# Patient Record
Sex: Female | Born: 1945 | Race: White | Hispanic: No | Marital: Married | Smoking: Current every day smoker
Health system: Southern US, Community
[De-identification: ages and names within clinical notes are randomized; demographics above are authoritative.]

## PROBLEM LIST (undated history)

## (undated) DIAGNOSIS — F32A Depression, unspecified: Secondary | ICD-10-CM

## (undated) DIAGNOSIS — T8859XA Other complications of anesthesia, initial encounter: Secondary | ICD-10-CM

## (undated) DIAGNOSIS — E119 Type 2 diabetes mellitus without complications: Secondary | ICD-10-CM

## (undated) DIAGNOSIS — M1990 Unspecified osteoarthritis, unspecified site: Secondary | ICD-10-CM

## (undated) DIAGNOSIS — I10 Essential (primary) hypertension: Secondary | ICD-10-CM

## (undated) DIAGNOSIS — Z9889 Other specified postprocedural states: Secondary | ICD-10-CM

## (undated) DIAGNOSIS — J449 Chronic obstructive pulmonary disease, unspecified: Secondary | ICD-10-CM

## (undated) DIAGNOSIS — Z8489 Family history of other specified conditions: Secondary | ICD-10-CM

## (undated) DIAGNOSIS — N189 Chronic kidney disease, unspecified: Secondary | ICD-10-CM

## (undated) DIAGNOSIS — E785 Hyperlipidemia, unspecified: Secondary | ICD-10-CM

## (undated) DIAGNOSIS — R112 Nausea with vomiting, unspecified: Secondary | ICD-10-CM

## (undated) DIAGNOSIS — F419 Anxiety disorder, unspecified: Secondary | ICD-10-CM

## (undated) HISTORY — PX: TONSILLECTOMY: SUR1361

## (undated) HISTORY — PX: EYE SURGERY: SHX253

## (undated) HISTORY — PX: ADENOIDECTOMY: SUR15

## (undated) HISTORY — PX: APPENDECTOMY: SHX54

## (undated) HISTORY — PX: OTHER SURGICAL HISTORY: SHX169

## (undated) HISTORY — PX: CHOLECYSTECTOMY: SHX55

## (undated) HISTORY — PX: TUBAL LIGATION: SHX77

---

## 2007-05-17 ENCOUNTER — Ambulatory Visit (HOSPITAL_BASED_OUTPATIENT_CLINIC_OR_DEPARTMENT_OTHER): Admission: RE | Admit: 2007-05-17 | Discharge: 2007-05-17 | Payer: Self-pay | Admitting: Orthopedic Surgery

## 2010-07-22 NOTE — Op Note (Signed)
NAMEOLUWATOYIN, BANALES                  ACCOUNT NO.:  [id number]   MEDICAL RECORD NO.:  [id number]          PATIENT TYPE:  AMB   LOCATION:  DSC                          FACILITY:  [facility]   PHYSICIAN:  Cindee Salt, M.D.       DATE OF BIRTH:  April 18, 1945   DATE OF PROCEDURE:  05/17/2007  DATE OF DISCHARGE:                               OPERATIVE REPORT   PREOPERATIVE DIAGNOSIS:  Carpal tunnel syndrome, right hand.   POSTOPERATIVE DIAGNOSIS:  Carpal tunnel syndrome, right hand.   OPERATION:  Decompression, right median nerve.   SURGEON:  Kuzma.   ASSISTANT:  Carolyne Fiscal, R.N.   ANESTHESIA:  Forearm-based IV regional.   DATE OF OPERATION:  May 17, 2007.   ANESTHESIOLOGIST:  Crews.   HISTORY:  The patient is a 65 year old female with a history of carpal  tunnel syndrome, EMG nerve conductions positive.  This has not responded  to conservative treatment.  She has elected to undergo carpal tunnel  release.  She is aware of risks and complications including infection,  recurrence, injury to arteries - nerves - tendons, incomplete relief of  symptoms, dystrophy.  In the preoperative area, the patient is seen.  The extremity marked by both the patient and surgeon.  Antibiotic given.   PROCEDURE:  The patient was brought to the operating room.  A forearm-  based IV regional anesthetic was carried out without difficulty.  She  was prepped using DuraPrep, supine position, right arm free.  After a  time-out, a longitudinal incision was made in the palm and carried down  through subcutaneous tissue.  Bleeders were electrocauterized.  Palmar  fascia split, superficial palmar arch identified.  The flexor tendon to  the ring and little finger identified to the ulnar side of median nerve.  Carpal retinaculum was incised with sharp dissection.  Right angle and  Sewall retractor were placed between skin and forearm fascia.  Fascia  released for approximately a centimeter and half proximal to the wrist  crease under direct vision.  Canal was explored.  Compression of the  nerve was apparent.  Persistent median artery was present without  thrombosis.  No further lesions were identified.  The wound was  irrigated.  Skin closed with interrupted 5-0 Vicryl Rapide sutures.  Sterile compressive dressing and  splint to the wrist applied.  The patient tolerated the procedure well  and was taken to recovery room for observation in satisfactory  condition.  She will be discharged home to return to the [facility]  [place] in 1 week on Vicodin.           ______________________________  Cindee Salt, M.D.     GK/MEDQ  D:  05/17/2007  T:  05/18/2007  Job:  [id number]   cc:   Cindee Salt, M.D.

## 2010-12-01 LAB — BASIC METABOLIC PANEL
BUN: 28 — ABNORMAL HIGH
CO2: 30
Calcium: 10.6 — ABNORMAL HIGH
Chloride: 103
Creatinine, Ser: 1.49 — ABNORMAL HIGH
GFR calc Af Amer: 43 — ABNORMAL LOW
GFR calc non Af Amer: 36 — ABNORMAL LOW
Glucose, Bld: 105 — ABNORMAL HIGH
Potassium: 4.8
Sodium: 140

## 2010-12-01 LAB — POCT HEMOGLOBIN-HEMACUE: Hemoglobin: 13.5

## 2012-12-08 DIAGNOSIS — R1312 Dysphagia, oropharyngeal phase: Secondary | ICD-10-CM | POA: Diagnosis not present

## 2012-12-08 DIAGNOSIS — Z6829 Body mass index (BMI) 29.0-29.9, adult: Secondary | ICD-10-CM | POA: Diagnosis not present

## 2012-12-08 DIAGNOSIS — Z1389 Encounter for screening for other disorder: Secondary | ICD-10-CM | POA: Diagnosis not present

## 2012-12-08 DIAGNOSIS — Z23 Encounter for immunization: Secondary | ICD-10-CM | POA: Diagnosis not present

## 2012-12-08 DIAGNOSIS — F17200 Nicotine dependence, unspecified, uncomplicated: Secondary | ICD-10-CM | POA: Diagnosis not present

## 2012-12-22 DIAGNOSIS — K219 Gastro-esophageal reflux disease without esophagitis: Secondary | ICD-10-CM | POA: Diagnosis not present

## 2012-12-22 DIAGNOSIS — R1310 Dysphagia, unspecified: Secondary | ICD-10-CM | POA: Diagnosis not present

## 2012-12-27 DIAGNOSIS — R1310 Dysphagia, unspecified: Secondary | ICD-10-CM | POA: Diagnosis not present

## 2012-12-27 DIAGNOSIS — K219 Gastro-esophageal reflux disease without esophagitis: Secondary | ICD-10-CM | POA: Diagnosis not present

## 2013-01-05 DIAGNOSIS — D509 Iron deficiency anemia, unspecified: Secondary | ICD-10-CM | POA: Diagnosis not present

## 2013-01-05 DIAGNOSIS — N183 Chronic kidney disease, stage 3 unspecified: Secondary | ICD-10-CM | POA: Diagnosis not present

## 2013-01-05 DIAGNOSIS — I10 Essential (primary) hypertension: Secondary | ICD-10-CM | POA: Diagnosis not present

## 2013-01-05 DIAGNOSIS — E1129 Type 2 diabetes mellitus with other diabetic kidney complication: Secondary | ICD-10-CM | POA: Diagnosis not present

## 2013-01-05 DIAGNOSIS — Z8349 Family history of other endocrine, nutritional and metabolic diseases: Secondary | ICD-10-CM | POA: Diagnosis not present

## 2013-01-05 DIAGNOSIS — R809 Proteinuria, unspecified: Secondary | ICD-10-CM | POA: Diagnosis not present

## 2013-01-11 DIAGNOSIS — K200 Eosinophilic esophagitis: Secondary | ICD-10-CM | POA: Diagnosis not present

## 2013-01-11 DIAGNOSIS — F411 Generalized anxiety disorder: Secondary | ICD-10-CM | POA: Diagnosis not present

## 2013-01-11 DIAGNOSIS — K209 Esophagitis, unspecified without bleeding: Secondary | ICD-10-CM | POA: Diagnosis not present

## 2013-01-11 DIAGNOSIS — K219 Gastro-esophageal reflux disease without esophagitis: Secondary | ICD-10-CM | POA: Diagnosis not present

## 2013-01-11 DIAGNOSIS — E119 Type 2 diabetes mellitus without complications: Secondary | ICD-10-CM | POA: Diagnosis not present

## 2013-01-11 DIAGNOSIS — E782 Mixed hyperlipidemia: Secondary | ICD-10-CM | POA: Diagnosis not present

## 2013-01-11 DIAGNOSIS — I129 Hypertensive chronic kidney disease with stage 1 through stage 4 chronic kidney disease, or unspecified chronic kidney disease: Secondary | ICD-10-CM | POA: Diagnosis not present

## 2013-01-11 DIAGNOSIS — K449 Diaphragmatic hernia without obstruction or gangrene: Secondary | ICD-10-CM | POA: Diagnosis not present

## 2013-01-11 DIAGNOSIS — N189 Chronic kidney disease, unspecified: Secondary | ICD-10-CM | POA: Diagnosis not present

## 2013-01-11 DIAGNOSIS — K222 Esophageal obstruction: Secondary | ICD-10-CM | POA: Diagnosis not present

## 2013-01-11 DIAGNOSIS — Z79899 Other long term (current) drug therapy: Secondary | ICD-10-CM | POA: Diagnosis not present

## 2013-01-11 DIAGNOSIS — D379 Neoplasm of uncertain behavior of digestive organ, unspecified: Secondary | ICD-10-CM | POA: Diagnosis not present

## 2013-01-11 DIAGNOSIS — R1310 Dysphagia, unspecified: Secondary | ICD-10-CM | POA: Diagnosis not present

## 2013-01-11 DIAGNOSIS — F329 Major depressive disorder, single episode, unspecified: Secondary | ICD-10-CM | POA: Diagnosis not present

## 2013-01-25 DIAGNOSIS — I10 Essential (primary) hypertension: Secondary | ICD-10-CM | POA: Diagnosis not present

## 2013-01-25 DIAGNOSIS — E1149 Type 2 diabetes mellitus with other diabetic neurological complication: Secondary | ICD-10-CM | POA: Diagnosis not present

## 2013-01-25 DIAGNOSIS — E782 Mixed hyperlipidemia: Secondary | ICD-10-CM | POA: Diagnosis not present

## 2013-02-01 DIAGNOSIS — N183 Chronic kidney disease, stage 3 unspecified: Secondary | ICD-10-CM | POA: Diagnosis not present

## 2013-02-01 DIAGNOSIS — F17200 Nicotine dependence, unspecified, uncomplicated: Secondary | ICD-10-CM | POA: Diagnosis not present

## 2013-02-01 DIAGNOSIS — Z1331 Encounter for screening for depression: Secondary | ICD-10-CM | POA: Diagnosis not present

## 2013-02-01 DIAGNOSIS — E1149 Type 2 diabetes mellitus with other diabetic neurological complication: Secondary | ICD-10-CM | POA: Diagnosis not present

## 2013-02-01 DIAGNOSIS — Z139 Encounter for screening, unspecified: Secondary | ICD-10-CM | POA: Diagnosis not present

## 2013-02-01 DIAGNOSIS — I129 Hypertensive chronic kidney disease with stage 1 through stage 4 chronic kidney disease, or unspecified chronic kidney disease: Secondary | ICD-10-CM | POA: Diagnosis not present

## 2013-02-01 DIAGNOSIS — Z23 Encounter for immunization: Secondary | ICD-10-CM | POA: Diagnosis not present

## 2013-02-10 DIAGNOSIS — Z1231 Encounter for screening mammogram for malignant neoplasm of breast: Secondary | ICD-10-CM | POA: Diagnosis not present

## 2013-03-08 DIAGNOSIS — R6889 Other general symptoms and signs: Secondary | ICD-10-CM | POA: Diagnosis not present

## 2013-03-21 DIAGNOSIS — K219 Gastro-esophageal reflux disease without esophagitis: Secondary | ICD-10-CM | POA: Diagnosis not present

## 2013-03-21 DIAGNOSIS — Z1211 Encounter for screening for malignant neoplasm of colon: Secondary | ICD-10-CM | POA: Diagnosis not present

## 2013-03-21 DIAGNOSIS — Z86010 Personal history of colonic polyps: Secondary | ICD-10-CM | POA: Diagnosis not present

## 2013-03-21 DIAGNOSIS — R1310 Dysphagia, unspecified: Secondary | ICD-10-CM | POA: Diagnosis not present

## 2013-05-15 DIAGNOSIS — E782 Mixed hyperlipidemia: Secondary | ICD-10-CM | POA: Diagnosis not present

## 2013-05-15 DIAGNOSIS — E1149 Type 2 diabetes mellitus with other diabetic neurological complication: Secondary | ICD-10-CM | POA: Diagnosis not present

## 2013-05-15 DIAGNOSIS — N183 Chronic kidney disease, stage 3 unspecified: Secondary | ICD-10-CM | POA: Diagnosis not present

## 2013-05-15 DIAGNOSIS — I129 Hypertensive chronic kidney disease with stage 1 through stage 4 chronic kidney disease, or unspecified chronic kidney disease: Secondary | ICD-10-CM | POA: Diagnosis not present

## 2013-05-19 DIAGNOSIS — N183 Chronic kidney disease, stage 3 unspecified: Secondary | ICD-10-CM | POA: Diagnosis not present

## 2013-05-19 DIAGNOSIS — H9190 Unspecified hearing loss, unspecified ear: Secondary | ICD-10-CM | POA: Diagnosis not present

## 2013-05-19 DIAGNOSIS — I129 Hypertensive chronic kidney disease with stage 1 through stage 4 chronic kidney disease, or unspecified chronic kidney disease: Secondary | ICD-10-CM | POA: Diagnosis not present

## 2013-05-19 DIAGNOSIS — Z1331 Encounter for screening for depression: Secondary | ICD-10-CM | POA: Diagnosis not present

## 2013-05-19 DIAGNOSIS — Z0000 Encounter for general adult medical examination without abnormal findings: Secondary | ICD-10-CM | POA: Diagnosis not present

## 2013-05-19 DIAGNOSIS — Z139 Encounter for screening, unspecified: Secondary | ICD-10-CM | POA: Diagnosis not present

## 2013-05-22 DIAGNOSIS — E1149 Type 2 diabetes mellitus with other diabetic neurological complication: Secondary | ICD-10-CM | POA: Diagnosis not present

## 2013-05-25 DIAGNOSIS — J301 Allergic rhinitis due to pollen: Secondary | ICD-10-CM | POA: Diagnosis not present

## 2013-05-31 DIAGNOSIS — K5730 Diverticulosis of large intestine without perforation or abscess without bleeding: Secondary | ICD-10-CM | POA: Diagnosis not present

## 2013-05-31 DIAGNOSIS — Z1211 Encounter for screening for malignant neoplasm of colon: Secondary | ICD-10-CM | POA: Diagnosis not present

## 2013-05-31 DIAGNOSIS — R1310 Dysphagia, unspecified: Secondary | ICD-10-CM | POA: Diagnosis not present

## 2013-05-31 DIAGNOSIS — I129 Hypertensive chronic kidney disease with stage 1 through stage 4 chronic kidney disease, or unspecified chronic kidney disease: Secondary | ICD-10-CM | POA: Diagnosis not present

## 2013-05-31 DIAGNOSIS — N189 Chronic kidney disease, unspecified: Secondary | ICD-10-CM | POA: Diagnosis not present

## 2013-05-31 DIAGNOSIS — E119 Type 2 diabetes mellitus without complications: Secondary | ICD-10-CM | POA: Diagnosis not present

## 2013-05-31 DIAGNOSIS — K200 Eosinophilic esophagitis: Secondary | ICD-10-CM | POA: Diagnosis not present

## 2013-05-31 DIAGNOSIS — Z79899 Other long term (current) drug therapy: Secondary | ICD-10-CM | POA: Diagnosis not present

## 2013-05-31 DIAGNOSIS — Z7982 Long term (current) use of aspirin: Secondary | ICD-10-CM | POA: Diagnosis not present

## 2013-05-31 DIAGNOSIS — F341 Dysthymic disorder: Secondary | ICD-10-CM | POA: Diagnosis not present

## 2013-05-31 DIAGNOSIS — F17200 Nicotine dependence, unspecified, uncomplicated: Secondary | ICD-10-CM | POA: Diagnosis not present

## 2013-05-31 DIAGNOSIS — D126 Benign neoplasm of colon, unspecified: Secondary | ICD-10-CM | POA: Diagnosis not present

## 2013-05-31 DIAGNOSIS — E785 Hyperlipidemia, unspecified: Secondary | ICD-10-CM | POA: Diagnosis not present

## 2013-05-31 DIAGNOSIS — K648 Other hemorrhoids: Secondary | ICD-10-CM | POA: Diagnosis not present

## 2013-06-22 DIAGNOSIS — N183 Chronic kidney disease, stage 3 unspecified: Secondary | ICD-10-CM | POA: Diagnosis not present

## 2013-06-22 DIAGNOSIS — R809 Proteinuria, unspecified: Secondary | ICD-10-CM | POA: Diagnosis not present

## 2013-06-23 DIAGNOSIS — E1129 Type 2 diabetes mellitus with other diabetic kidney complication: Secondary | ICD-10-CM | POA: Diagnosis not present

## 2013-06-23 DIAGNOSIS — R809 Proteinuria, unspecified: Secondary | ICD-10-CM | POA: Diagnosis not present

## 2013-06-23 DIAGNOSIS — N183 Chronic kidney disease, stage 3 unspecified: Secondary | ICD-10-CM | POA: Diagnosis not present

## 2013-06-23 DIAGNOSIS — I10 Essential (primary) hypertension: Secondary | ICD-10-CM | POA: Diagnosis not present

## 2013-08-08 DIAGNOSIS — E1149 Type 2 diabetes mellitus with other diabetic neurological complication: Secondary | ICD-10-CM | POA: Diagnosis not present

## 2013-08-08 DIAGNOSIS — I129 Hypertensive chronic kidney disease with stage 1 through stage 4 chronic kidney disease, or unspecified chronic kidney disease: Secondary | ICD-10-CM | POA: Diagnosis not present

## 2013-08-08 DIAGNOSIS — E782 Mixed hyperlipidemia: Secondary | ICD-10-CM | POA: Diagnosis not present

## 2013-08-08 DIAGNOSIS — N183 Chronic kidney disease, stage 3 unspecified: Secondary | ICD-10-CM | POA: Diagnosis not present

## 2013-08-15 DIAGNOSIS — N183 Chronic kidney disease, stage 3 unspecified: Secondary | ICD-10-CM | POA: Diagnosis not present

## 2013-08-15 DIAGNOSIS — E782 Mixed hyperlipidemia: Secondary | ICD-10-CM | POA: Diagnosis not present

## 2013-08-15 DIAGNOSIS — I129 Hypertensive chronic kidney disease with stage 1 through stage 4 chronic kidney disease, or unspecified chronic kidney disease: Secondary | ICD-10-CM | POA: Diagnosis not present

## 2013-08-15 DIAGNOSIS — E1149 Type 2 diabetes mellitus with other diabetic neurological complication: Secondary | ICD-10-CM | POA: Diagnosis not present

## 2013-09-12 DIAGNOSIS — N183 Chronic kidney disease, stage 3 unspecified: Secondary | ICD-10-CM | POA: Diagnosis not present

## 2013-10-03 DIAGNOSIS — H524 Presbyopia: Secondary | ICD-10-CM | POA: Diagnosis not present

## 2013-10-03 DIAGNOSIS — E119 Type 2 diabetes mellitus without complications: Secondary | ICD-10-CM | POA: Diagnosis not present

## 2013-10-05 DIAGNOSIS — N183 Chronic kidney disease, stage 3 unspecified: Secondary | ICD-10-CM | POA: Diagnosis not present

## 2013-12-15 DIAGNOSIS — E1140 Type 2 diabetes mellitus with diabetic neuropathy, unspecified: Secondary | ICD-10-CM | POA: Diagnosis not present

## 2013-12-15 DIAGNOSIS — E782 Mixed hyperlipidemia: Secondary | ICD-10-CM | POA: Diagnosis not present

## 2013-12-15 DIAGNOSIS — I129 Hypertensive chronic kidney disease with stage 1 through stage 4 chronic kidney disease, or unspecified chronic kidney disease: Secondary | ICD-10-CM | POA: Diagnosis not present

## 2013-12-22 DIAGNOSIS — I129 Hypertensive chronic kidney disease with stage 1 through stage 4 chronic kidney disease, or unspecified chronic kidney disease: Secondary | ICD-10-CM | POA: Diagnosis not present

## 2013-12-22 DIAGNOSIS — R7989 Other specified abnormal findings of blood chemistry: Secondary | ICD-10-CM | POA: Diagnosis not present

## 2013-12-22 DIAGNOSIS — E1140 Type 2 diabetes mellitus with diabetic neuropathy, unspecified: Secondary | ICD-10-CM | POA: Diagnosis not present

## 2013-12-22 DIAGNOSIS — E785 Hyperlipidemia, unspecified: Secondary | ICD-10-CM | POA: Diagnosis not present

## 2014-01-03 DIAGNOSIS — K760 Fatty (change of) liver, not elsewhere classified: Secondary | ICD-10-CM | POA: Diagnosis not present

## 2014-01-03 DIAGNOSIS — Z09 Encounter for follow-up examination after completed treatment for conditions other than malignant neoplasm: Secondary | ICD-10-CM | POA: Diagnosis not present

## 2014-01-04 DIAGNOSIS — F17200 Nicotine dependence, unspecified, uncomplicated: Secondary | ICD-10-CM | POA: Diagnosis not present

## 2014-01-04 DIAGNOSIS — R748 Abnormal levels of other serum enzymes: Secondary | ICD-10-CM | POA: Diagnosis not present

## 2014-01-04 DIAGNOSIS — K760 Fatty (change of) liver, not elsewhere classified: Secondary | ICD-10-CM | POA: Diagnosis not present

## 2014-01-04 DIAGNOSIS — Z6830 Body mass index (BMI) 30.0-30.9, adult: Secondary | ICD-10-CM | POA: Diagnosis not present

## 2014-02-09 DIAGNOSIS — K760 Fatty (change of) liver, not elsewhere classified: Secondary | ICD-10-CM | POA: Diagnosis not present

## 2014-02-12 DIAGNOSIS — R7989 Other specified abnormal findings of blood chemistry: Secondary | ICD-10-CM | POA: Diagnosis not present

## 2014-02-12 DIAGNOSIS — R740 Nonspecific elevation of levels of transaminase and lactic acid dehydrogenase [LDH]: Secondary | ICD-10-CM | POA: Diagnosis not present

## 2014-02-12 DIAGNOSIS — K760 Fatty (change of) liver, not elsewhere classified: Secondary | ICD-10-CM | POA: Diagnosis not present

## 2014-03-08 DIAGNOSIS — R05 Cough: Secondary | ICD-10-CM | POA: Diagnosis not present

## 2014-03-08 DIAGNOSIS — Z6829 Body mass index (BMI) 29.0-29.9, adult: Secondary | ICD-10-CM | POA: Diagnosis not present

## 2014-03-08 DIAGNOSIS — F17200 Nicotine dependence, unspecified, uncomplicated: Secondary | ICD-10-CM | POA: Diagnosis not present

## 2014-03-09 DIAGNOSIS — D649 Anemia, unspecified: Secondary | ICD-10-CM

## 2014-03-09 HISTORY — DX: Anemia, unspecified: D64.9

## 2014-03-12 DIAGNOSIS — I129 Hypertensive chronic kidney disease with stage 1 through stage 4 chronic kidney disease, or unspecified chronic kidney disease: Secondary | ICD-10-CM | POA: Diagnosis not present

## 2014-03-12 DIAGNOSIS — E1144 Type 2 diabetes mellitus with diabetic amyotrophy: Secondary | ICD-10-CM | POA: Diagnosis not present

## 2014-03-12 DIAGNOSIS — E782 Mixed hyperlipidemia: Secondary | ICD-10-CM | POA: Diagnosis not present

## 2014-03-22 DIAGNOSIS — E1144 Type 2 diabetes mellitus with diabetic amyotrophy: Secondary | ICD-10-CM | POA: Diagnosis not present

## 2014-03-22 DIAGNOSIS — N183 Chronic kidney disease, stage 3 (moderate): Secondary | ICD-10-CM | POA: Diagnosis not present

## 2014-03-22 DIAGNOSIS — Z23 Encounter for immunization: Secondary | ICD-10-CM | POA: Diagnosis not present

## 2014-03-22 DIAGNOSIS — K760 Fatty (change of) liver, not elsewhere classified: Secondary | ICD-10-CM | POA: Diagnosis not present

## 2014-03-22 DIAGNOSIS — I129 Hypertensive chronic kidney disease with stage 1 through stage 4 chronic kidney disease, or unspecified chronic kidney disease: Secondary | ICD-10-CM | POA: Diagnosis not present

## 2014-03-28 DIAGNOSIS — Z1231 Encounter for screening mammogram for malignant neoplasm of breast: Secondary | ICD-10-CM | POA: Diagnosis not present

## 2014-04-17 DIAGNOSIS — R809 Proteinuria, unspecified: Secondary | ICD-10-CM | POA: Diagnosis not present

## 2014-04-17 DIAGNOSIS — N183 Chronic kidney disease, stage 3 (moderate): Secondary | ICD-10-CM | POA: Diagnosis not present

## 2014-04-20 DIAGNOSIS — E1129 Type 2 diabetes mellitus with other diabetic kidney complication: Secondary | ICD-10-CM | POA: Diagnosis not present

## 2014-04-20 DIAGNOSIS — N183 Chronic kidney disease, stage 3 (moderate): Secondary | ICD-10-CM | POA: Diagnosis not present

## 2014-04-20 DIAGNOSIS — R809 Proteinuria, unspecified: Secondary | ICD-10-CM | POA: Diagnosis not present

## 2014-04-20 DIAGNOSIS — I10 Essential (primary) hypertension: Secondary | ICD-10-CM | POA: Diagnosis not present

## 2014-05-25 DIAGNOSIS — Z23 Encounter for immunization: Secondary | ICD-10-CM | POA: Diagnosis not present

## 2014-06-06 DIAGNOSIS — Z139 Encounter for screening, unspecified: Secondary | ICD-10-CM | POA: Diagnosis not present

## 2014-06-06 DIAGNOSIS — Z1389 Encounter for screening for other disorder: Secondary | ICD-10-CM | POA: Diagnosis not present

## 2014-06-06 DIAGNOSIS — Z0000 Encounter for general adult medical examination without abnormal findings: Secondary | ICD-10-CM | POA: Diagnosis not present

## 2014-06-14 DIAGNOSIS — E782 Mixed hyperlipidemia: Secondary | ICD-10-CM | POA: Diagnosis not present

## 2014-06-14 DIAGNOSIS — I129 Hypertensive chronic kidney disease with stage 1 through stage 4 chronic kidney disease, or unspecified chronic kidney disease: Secondary | ICD-10-CM | POA: Diagnosis not present

## 2014-06-14 DIAGNOSIS — E1144 Type 2 diabetes mellitus with diabetic amyotrophy: Secondary | ICD-10-CM | POA: Diagnosis not present

## 2014-06-21 DIAGNOSIS — E1169 Type 2 diabetes mellitus with other specified complication: Secondary | ICD-10-CM | POA: Diagnosis not present

## 2014-06-21 DIAGNOSIS — E1144 Type 2 diabetes mellitus with diabetic amyotrophy: Secondary | ICD-10-CM | POA: Diagnosis not present

## 2014-06-21 DIAGNOSIS — K760 Fatty (change of) liver, not elsewhere classified: Secondary | ICD-10-CM | POA: Diagnosis not present

## 2014-06-21 DIAGNOSIS — K200 Eosinophilic esophagitis: Secondary | ICD-10-CM | POA: Diagnosis not present

## 2014-06-21 DIAGNOSIS — I129 Hypertensive chronic kidney disease with stage 1 through stage 4 chronic kidney disease, or unspecified chronic kidney disease: Secondary | ICD-10-CM | POA: Diagnosis not present

## 2014-06-21 DIAGNOSIS — K219 Gastro-esophageal reflux disease without esophagitis: Secondary | ICD-10-CM | POA: Diagnosis not present

## 2014-06-21 DIAGNOSIS — N183 Chronic kidney disease, stage 3 (moderate): Secondary | ICD-10-CM | POA: Diagnosis not present

## 2014-06-21 DIAGNOSIS — F17200 Nicotine dependence, unspecified, uncomplicated: Secondary | ICD-10-CM | POA: Diagnosis not present

## 2014-06-28 DIAGNOSIS — Z87891 Personal history of nicotine dependence: Secondary | ICD-10-CM | POA: Diagnosis not present

## 2014-06-28 DIAGNOSIS — J9809 Other diseases of bronchus, not elsewhere classified: Secondary | ICD-10-CM | POA: Diagnosis not present

## 2014-06-28 DIAGNOSIS — J432 Centrilobular emphysema: Secondary | ICD-10-CM | POA: Diagnosis not present

## 2014-07-09 DIAGNOSIS — R911 Solitary pulmonary nodule: Secondary | ICD-10-CM | POA: Diagnosis not present

## 2014-07-09 DIAGNOSIS — Z6830 Body mass index (BMI) 30.0-30.9, adult: Secondary | ICD-10-CM | POA: Diagnosis not present

## 2014-09-05 DIAGNOSIS — R87610 Atypical squamous cells of undetermined significance on cytologic smear of cervix (ASC-US): Secondary | ICD-10-CM | POA: Diagnosis not present

## 2014-09-05 DIAGNOSIS — R87810 Cervical high risk human papillomavirus (HPV) DNA test positive: Secondary | ICD-10-CM | POA: Diagnosis not present

## 2014-09-21 DIAGNOSIS — Z23 Encounter for immunization: Secondary | ICD-10-CM | POA: Diagnosis not present

## 2014-10-22 DIAGNOSIS — E1169 Type 2 diabetes mellitus with other specified complication: Secondary | ICD-10-CM | POA: Diagnosis not present

## 2014-10-22 DIAGNOSIS — E1144 Type 2 diabetes mellitus with diabetic amyotrophy: Secondary | ICD-10-CM | POA: Diagnosis not present

## 2014-10-22 DIAGNOSIS — I129 Hypertensive chronic kidney disease with stage 1 through stage 4 chronic kidney disease, or unspecified chronic kidney disease: Secondary | ICD-10-CM | POA: Diagnosis not present

## 2014-10-29 DIAGNOSIS — E782 Mixed hyperlipidemia: Secondary | ICD-10-CM | POA: Diagnosis not present

## 2014-10-29 DIAGNOSIS — I129 Hypertensive chronic kidney disease with stage 1 through stage 4 chronic kidney disease, or unspecified chronic kidney disease: Secondary | ICD-10-CM | POA: Diagnosis not present

## 2014-10-29 DIAGNOSIS — R5383 Other fatigue: Secondary | ICD-10-CM | POA: Diagnosis not present

## 2014-10-29 DIAGNOSIS — E1169 Type 2 diabetes mellitus with other specified complication: Secondary | ICD-10-CM | POA: Diagnosis not present

## 2014-10-29 DIAGNOSIS — N183 Chronic kidney disease, stage 3 (moderate): Secondary | ICD-10-CM | POA: Diagnosis not present

## 2014-10-29 DIAGNOSIS — R7989 Other specified abnormal findings of blood chemistry: Secondary | ICD-10-CM | POA: Diagnosis not present

## 2014-10-29 DIAGNOSIS — K760 Fatty (change of) liver, not elsewhere classified: Secondary | ICD-10-CM | POA: Diagnosis not present

## 2014-11-19 DIAGNOSIS — E119 Type 2 diabetes mellitus without complications: Secondary | ICD-10-CM | POA: Diagnosis not present

## 2014-11-19 DIAGNOSIS — Z961 Presence of intraocular lens: Secondary | ICD-10-CM | POA: Diagnosis not present

## 2014-12-05 DIAGNOSIS — R5383 Other fatigue: Secondary | ICD-10-CM | POA: Diagnosis not present

## 2014-12-05 DIAGNOSIS — Z6830 Body mass index (BMI) 30.0-30.9, adult: Secondary | ICD-10-CM | POA: Diagnosis not present

## 2014-12-05 DIAGNOSIS — F17200 Nicotine dependence, unspecified, uncomplicated: Secondary | ICD-10-CM | POA: Diagnosis not present

## 2014-12-07 DIAGNOSIS — R5383 Other fatigue: Secondary | ICD-10-CM | POA: Diagnosis not present

## 2015-01-15 DIAGNOSIS — Z23 Encounter for immunization: Secondary | ICD-10-CM | POA: Diagnosis not present

## 2015-02-20 DIAGNOSIS — E1144 Type 2 diabetes mellitus with diabetic amyotrophy: Secondary | ICD-10-CM | POA: Diagnosis not present

## 2015-02-20 DIAGNOSIS — I129 Hypertensive chronic kidney disease with stage 1 through stage 4 chronic kidney disease, or unspecified chronic kidney disease: Secondary | ICD-10-CM | POA: Diagnosis not present

## 2015-02-20 DIAGNOSIS — E1169 Type 2 diabetes mellitus with other specified complication: Secondary | ICD-10-CM | POA: Diagnosis not present

## 2015-04-08 DIAGNOSIS — R809 Proteinuria, unspecified: Secondary | ICD-10-CM | POA: Diagnosis not present

## 2015-04-08 DIAGNOSIS — E785 Hyperlipidemia, unspecified: Secondary | ICD-10-CM | POA: Diagnosis not present

## 2015-04-08 DIAGNOSIS — E1129 Type 2 diabetes mellitus with other diabetic kidney complication: Secondary | ICD-10-CM | POA: Diagnosis not present

## 2015-04-08 DIAGNOSIS — E1140 Type 2 diabetes mellitus with diabetic neuropathy, unspecified: Secondary | ICD-10-CM | POA: Diagnosis not present

## 2015-04-08 DIAGNOSIS — I129 Hypertensive chronic kidney disease with stage 1 through stage 4 chronic kidney disease, or unspecified chronic kidney disease: Secondary | ICD-10-CM | POA: Diagnosis not present

## 2015-04-08 DIAGNOSIS — I10 Essential (primary) hypertension: Secondary | ICD-10-CM | POA: Diagnosis not present

## 2015-04-08 DIAGNOSIS — N183 Chronic kidney disease, stage 3 (moderate): Secondary | ICD-10-CM | POA: Diagnosis not present

## 2015-04-08 DIAGNOSIS — F17200 Nicotine dependence, unspecified, uncomplicated: Secondary | ICD-10-CM | POA: Diagnosis not present

## 2015-04-08 DIAGNOSIS — Z720 Tobacco use: Secondary | ICD-10-CM | POA: Diagnosis not present

## 2015-04-17 DIAGNOSIS — M8589 Other specified disorders of bone density and structure, multiple sites: Secondary | ICD-10-CM | POA: Diagnosis not present

## 2015-04-17 DIAGNOSIS — Z13820 Encounter for screening for osteoporosis: Secondary | ICD-10-CM | POA: Diagnosis not present

## 2015-04-17 DIAGNOSIS — Z780 Asymptomatic menopausal state: Secondary | ICD-10-CM | POA: Diagnosis not present

## 2015-04-17 DIAGNOSIS — Z1231 Encounter for screening mammogram for malignant neoplasm of breast: Secondary | ICD-10-CM | POA: Diagnosis not present

## 2015-04-24 DIAGNOSIS — N63 Unspecified lump in breast: Secondary | ICD-10-CM | POA: Diagnosis not present

## 2015-04-24 DIAGNOSIS — N6001 Solitary cyst of right breast: Secondary | ICD-10-CM | POA: Diagnosis not present

## 2015-05-13 DIAGNOSIS — I10 Essential (primary) hypertension: Secondary | ICD-10-CM | POA: Diagnosis not present

## 2015-05-13 DIAGNOSIS — G629 Polyneuropathy, unspecified: Secondary | ICD-10-CM | POA: Diagnosis not present

## 2015-05-13 DIAGNOSIS — Z7189 Other specified counseling: Secondary | ICD-10-CM | POA: Diagnosis not present

## 2015-05-13 DIAGNOSIS — E1169 Type 2 diabetes mellitus with other specified complication: Secondary | ICD-10-CM | POA: Diagnosis not present

## 2015-05-13 DIAGNOSIS — E782 Mixed hyperlipidemia: Secondary | ICD-10-CM | POA: Diagnosis not present

## 2015-06-06 DIAGNOSIS — Z87891 Personal history of nicotine dependence: Secondary | ICD-10-CM | POA: Diagnosis not present

## 2015-06-06 DIAGNOSIS — R918 Other nonspecific abnormal finding of lung field: Secondary | ICD-10-CM | POA: Diagnosis not present

## 2015-06-11 DIAGNOSIS — W57XXXA Bitten or stung by nonvenomous insect and other nonvenomous arthropods, initial encounter: Secondary | ICD-10-CM | POA: Diagnosis not present

## 2015-06-11 DIAGNOSIS — Z720 Tobacco use: Secondary | ICD-10-CM | POA: Diagnosis not present

## 2015-06-11 DIAGNOSIS — Z0000 Encounter for general adult medical examination without abnormal findings: Secondary | ICD-10-CM | POA: Diagnosis not present

## 2015-06-11 DIAGNOSIS — Z139 Encounter for screening, unspecified: Secondary | ICD-10-CM | POA: Diagnosis not present

## 2015-06-11 DIAGNOSIS — Z1389 Encounter for screening for other disorder: Secondary | ICD-10-CM | POA: Diagnosis not present

## 2015-06-27 DIAGNOSIS — E1140 Type 2 diabetes mellitus with diabetic neuropathy, unspecified: Secondary | ICD-10-CM | POA: Diagnosis not present

## 2015-06-27 DIAGNOSIS — I129 Hypertensive chronic kidney disease with stage 1 through stage 4 chronic kidney disease, or unspecified chronic kidney disease: Secondary | ICD-10-CM | POA: Diagnosis not present

## 2015-06-27 DIAGNOSIS — N183 Chronic kidney disease, stage 3 (moderate): Secondary | ICD-10-CM | POA: Diagnosis not present

## 2015-06-27 DIAGNOSIS — F17200 Nicotine dependence, unspecified, uncomplicated: Secondary | ICD-10-CM | POA: Diagnosis not present

## 2015-07-09 DIAGNOSIS — J029 Acute pharyngitis, unspecified: Secondary | ICD-10-CM | POA: Diagnosis not present

## 2015-07-09 DIAGNOSIS — R911 Solitary pulmonary nodule: Secondary | ICD-10-CM | POA: Diagnosis not present

## 2015-07-09 DIAGNOSIS — R05 Cough: Secondary | ICD-10-CM | POA: Diagnosis not present

## 2015-07-09 DIAGNOSIS — Z6829 Body mass index (BMI) 29.0-29.9, adult: Secondary | ICD-10-CM | POA: Diagnosis not present

## 2015-07-24 DIAGNOSIS — I129 Hypertensive chronic kidney disease with stage 1 through stage 4 chronic kidney disease, or unspecified chronic kidney disease: Secondary | ICD-10-CM | POA: Diagnosis not present

## 2015-07-24 DIAGNOSIS — E1169 Type 2 diabetes mellitus with other specified complication: Secondary | ICD-10-CM | POA: Diagnosis not present

## 2015-07-24 DIAGNOSIS — E782 Mixed hyperlipidemia: Secondary | ICD-10-CM | POA: Diagnosis not present

## 2015-07-29 DIAGNOSIS — E1169 Type 2 diabetes mellitus with other specified complication: Secondary | ICD-10-CM | POA: Diagnosis not present

## 2015-07-29 DIAGNOSIS — E782 Mixed hyperlipidemia: Secondary | ICD-10-CM | POA: Diagnosis not present

## 2015-07-29 DIAGNOSIS — I10 Essential (primary) hypertension: Secondary | ICD-10-CM | POA: Diagnosis not present

## 2015-07-29 DIAGNOSIS — F17200 Nicotine dependence, unspecified, uncomplicated: Secondary | ICD-10-CM | POA: Diagnosis not present

## 2015-07-29 DIAGNOSIS — I129 Hypertensive chronic kidney disease with stage 1 through stage 4 chronic kidney disease, or unspecified chronic kidney disease: Secondary | ICD-10-CM | POA: Diagnosis not present

## 2015-07-29 DIAGNOSIS — N183 Chronic kidney disease, stage 3 (moderate): Secondary | ICD-10-CM | POA: Diagnosis not present

## 2015-07-29 DIAGNOSIS — G629 Polyneuropathy, unspecified: Secondary | ICD-10-CM | POA: Diagnosis not present

## 2015-08-20 DIAGNOSIS — J302 Other seasonal allergic rhinitis: Secondary | ICD-10-CM | POA: Diagnosis not present

## 2015-08-20 DIAGNOSIS — F17200 Nicotine dependence, unspecified, uncomplicated: Secondary | ICD-10-CM | POA: Diagnosis not present

## 2015-08-20 DIAGNOSIS — Z6830 Body mass index (BMI) 30.0-30.9, adult: Secondary | ICD-10-CM | POA: Diagnosis not present

## 2015-08-20 DIAGNOSIS — R05 Cough: Secondary | ICD-10-CM | POA: Diagnosis not present

## 2015-09-12 DIAGNOSIS — E782 Mixed hyperlipidemia: Secondary | ICD-10-CM | POA: Diagnosis not present

## 2015-09-12 DIAGNOSIS — G629 Polyneuropathy, unspecified: Secondary | ICD-10-CM | POA: Diagnosis not present

## 2015-09-12 DIAGNOSIS — E1169 Type 2 diabetes mellitus with other specified complication: Secondary | ICD-10-CM | POA: Diagnosis not present

## 2015-09-12 DIAGNOSIS — I10 Essential (primary) hypertension: Secondary | ICD-10-CM | POA: Diagnosis not present

## 2015-10-29 DIAGNOSIS — I129 Hypertensive chronic kidney disease with stage 1 through stage 4 chronic kidney disease, or unspecified chronic kidney disease: Secondary | ICD-10-CM | POA: Diagnosis not present

## 2015-10-29 DIAGNOSIS — E782 Mixed hyperlipidemia: Secondary | ICD-10-CM | POA: Diagnosis not present

## 2015-10-29 DIAGNOSIS — E1169 Type 2 diabetes mellitus with other specified complication: Secondary | ICD-10-CM | POA: Diagnosis not present

## 2015-11-05 DIAGNOSIS — E1169 Type 2 diabetes mellitus with other specified complication: Secondary | ICD-10-CM | POA: Diagnosis not present

## 2015-11-05 DIAGNOSIS — E782 Mixed hyperlipidemia: Secondary | ICD-10-CM | POA: Diagnosis not present

## 2015-11-05 DIAGNOSIS — N183 Chronic kidney disease, stage 3 (moderate): Secondary | ICD-10-CM | POA: Diagnosis not present

## 2015-11-05 DIAGNOSIS — I129 Hypertensive chronic kidney disease with stage 1 through stage 4 chronic kidney disease, or unspecified chronic kidney disease: Secondary | ICD-10-CM | POA: Diagnosis not present

## 2015-11-13 DIAGNOSIS — S40012A Contusion of left shoulder, initial encounter: Secondary | ICD-10-CM | POA: Diagnosis not present

## 2015-11-13 DIAGNOSIS — M25512 Pain in left shoulder: Secondary | ICD-10-CM | POA: Diagnosis not present

## 2015-11-13 DIAGNOSIS — S4992XA Unspecified injury of left shoulder and upper arm, initial encounter: Secondary | ICD-10-CM | POA: Diagnosis not present

## 2015-11-25 DIAGNOSIS — N183 Chronic kidney disease, stage 3 (moderate): Secondary | ICD-10-CM | POA: Diagnosis not present

## 2015-11-25 DIAGNOSIS — E1169 Type 2 diabetes mellitus with other specified complication: Secondary | ICD-10-CM | POA: Diagnosis not present

## 2015-11-25 DIAGNOSIS — I129 Hypertensive chronic kidney disease with stage 1 through stage 4 chronic kidney disease, or unspecified chronic kidney disease: Secondary | ICD-10-CM | POA: Diagnosis not present

## 2015-11-25 DIAGNOSIS — E782 Mixed hyperlipidemia: Secondary | ICD-10-CM | POA: Diagnosis not present

## 2015-11-27 DIAGNOSIS — M50323 Other cervical disc degeneration at C6-C7 level: Secondary | ICD-10-CM | POA: Diagnosis not present

## 2015-11-27 DIAGNOSIS — M25512 Pain in left shoulder: Secondary | ICD-10-CM | POA: Diagnosis not present

## 2015-11-27 DIAGNOSIS — M50322 Other cervical disc degeneration at C5-C6 level: Secondary | ICD-10-CM | POA: Diagnosis not present

## 2015-11-27 DIAGNOSIS — M542 Cervicalgia: Secondary | ICD-10-CM | POA: Diagnosis not present

## 2015-11-27 DIAGNOSIS — Z6829 Body mass index (BMI) 29.0-29.9, adult: Secondary | ICD-10-CM | POA: Diagnosis not present

## 2015-11-27 DIAGNOSIS — W19XXXA Unspecified fall, initial encounter: Secondary | ICD-10-CM | POA: Diagnosis not present

## 2015-11-27 DIAGNOSIS — S4992XA Unspecified injury of left shoulder and upper arm, initial encounter: Secondary | ICD-10-CM | POA: Diagnosis not present

## 2015-11-27 DIAGNOSIS — M898X1 Other specified disorders of bone, shoulder: Secondary | ICD-10-CM | POA: Diagnosis not present

## 2015-12-10 DIAGNOSIS — H35362 Drusen (degenerative) of macula, left eye: Secondary | ICD-10-CM | POA: Diagnosis not present

## 2015-12-10 DIAGNOSIS — E119 Type 2 diabetes mellitus without complications: Secondary | ICD-10-CM | POA: Diagnosis not present

## 2015-12-18 DIAGNOSIS — Z23 Encounter for immunization: Secondary | ICD-10-CM | POA: Diagnosis not present

## 2015-12-24 DIAGNOSIS — Z720 Tobacco use: Secondary | ICD-10-CM | POA: Diagnosis not present

## 2015-12-24 DIAGNOSIS — E785 Hyperlipidemia, unspecified: Secondary | ICD-10-CM | POA: Diagnosis not present

## 2015-12-24 DIAGNOSIS — N183 Chronic kidney disease, stage 3 (moderate): Secondary | ICD-10-CM | POA: Diagnosis not present

## 2015-12-24 DIAGNOSIS — R809 Proteinuria, unspecified: Secondary | ICD-10-CM | POA: Diagnosis not present

## 2015-12-24 DIAGNOSIS — I10 Essential (primary) hypertension: Secondary | ICD-10-CM | POA: Diagnosis not present

## 2015-12-24 DIAGNOSIS — E1129 Type 2 diabetes mellitus with other diabetic kidney complication: Secondary | ICD-10-CM | POA: Diagnosis not present

## 2016-02-04 DIAGNOSIS — R05 Cough: Secondary | ICD-10-CM | POA: Diagnosis not present

## 2016-03-16 DIAGNOSIS — J449 Chronic obstructive pulmonary disease, unspecified: Secondary | ICD-10-CM | POA: Diagnosis not present

## 2016-03-16 DIAGNOSIS — Z720 Tobacco use: Secondary | ICD-10-CM | POA: Diagnosis not present

## 2016-03-27 DIAGNOSIS — E1169 Type 2 diabetes mellitus with other specified complication: Secondary | ICD-10-CM | POA: Diagnosis not present

## 2016-03-27 DIAGNOSIS — I129 Hypertensive chronic kidney disease with stage 1 through stage 4 chronic kidney disease, or unspecified chronic kidney disease: Secondary | ICD-10-CM | POA: Diagnosis not present

## 2016-04-01 DIAGNOSIS — E782 Mixed hyperlipidemia: Secondary | ICD-10-CM | POA: Diagnosis not present

## 2016-04-01 DIAGNOSIS — I129 Hypertensive chronic kidney disease with stage 1 through stage 4 chronic kidney disease, or unspecified chronic kidney disease: Secondary | ICD-10-CM | POA: Diagnosis not present

## 2016-04-01 DIAGNOSIS — E1169 Type 2 diabetes mellitus with other specified complication: Secondary | ICD-10-CM | POA: Diagnosis not present

## 2016-04-01 DIAGNOSIS — N183 Chronic kidney disease, stage 3 (moderate): Secondary | ICD-10-CM | POA: Diagnosis not present

## 2016-04-01 DIAGNOSIS — Z720 Tobacco use: Secondary | ICD-10-CM | POA: Diagnosis not present

## 2016-04-24 DIAGNOSIS — E1140 Type 2 diabetes mellitus with diabetic neuropathy, unspecified: Secondary | ICD-10-CM | POA: Diagnosis not present

## 2016-04-24 DIAGNOSIS — Z0000 Encounter for general adult medical examination without abnormal findings: Secondary | ICD-10-CM | POA: Diagnosis not present

## 2016-04-24 DIAGNOSIS — N183 Chronic kidney disease, stage 3 (moderate): Secondary | ICD-10-CM | POA: Diagnosis not present

## 2016-04-24 DIAGNOSIS — K760 Fatty (change of) liver, not elsewhere classified: Secondary | ICD-10-CM | POA: Diagnosis not present

## 2016-04-24 DIAGNOSIS — Z01419 Encounter for gynecological examination (general) (routine) without abnormal findings: Secondary | ICD-10-CM | POA: Diagnosis not present

## 2016-04-24 DIAGNOSIS — Z1211 Encounter for screening for malignant neoplasm of colon: Secondary | ICD-10-CM | POA: Diagnosis not present

## 2016-04-24 DIAGNOSIS — I129 Hypertensive chronic kidney disease with stage 1 through stage 4 chronic kidney disease, or unspecified chronic kidney disease: Secondary | ICD-10-CM | POA: Diagnosis not present

## 2016-05-05 DIAGNOSIS — Z1231 Encounter for screening mammogram for malignant neoplasm of breast: Secondary | ICD-10-CM | POA: Diagnosis not present

## 2016-07-29 DIAGNOSIS — I129 Hypertensive chronic kidney disease with stage 1 through stage 4 chronic kidney disease, or unspecified chronic kidney disease: Secondary | ICD-10-CM | POA: Diagnosis not present

## 2016-07-29 DIAGNOSIS — E1169 Type 2 diabetes mellitus with other specified complication: Secondary | ICD-10-CM | POA: Diagnosis not present

## 2016-07-29 DIAGNOSIS — E782 Mixed hyperlipidemia: Secondary | ICD-10-CM | POA: Diagnosis not present

## 2016-09-14 DIAGNOSIS — I129 Hypertensive chronic kidney disease with stage 1 through stage 4 chronic kidney disease, or unspecified chronic kidney disease: Secondary | ICD-10-CM | POA: Diagnosis not present

## 2016-09-14 DIAGNOSIS — E782 Mixed hyperlipidemia: Secondary | ICD-10-CM | POA: Diagnosis not present

## 2016-09-14 DIAGNOSIS — Z139 Encounter for screening, unspecified: Secondary | ICD-10-CM | POA: Diagnosis not present

## 2016-09-14 DIAGNOSIS — E1140 Type 2 diabetes mellitus with diabetic neuropathy, unspecified: Secondary | ICD-10-CM | POA: Diagnosis not present

## 2016-09-14 DIAGNOSIS — E663 Overweight: Secondary | ICD-10-CM | POA: Diagnosis not present

## 2016-09-14 DIAGNOSIS — Z0000 Encounter for general adult medical examination without abnormal findings: Secondary | ICD-10-CM | POA: Diagnosis not present

## 2016-09-14 DIAGNOSIS — N183 Chronic kidney disease, stage 3 (moderate): Secondary | ICD-10-CM | POA: Diagnosis not present

## 2016-09-14 DIAGNOSIS — Z1389 Encounter for screening for other disorder: Secondary | ICD-10-CM | POA: Diagnosis not present

## 2016-09-28 ENCOUNTER — Other Ambulatory Visit: Payer: Self-pay

## 2016-11-05 DIAGNOSIS — D509 Iron deficiency anemia, unspecified: Secondary | ICD-10-CM | POA: Diagnosis not present

## 2016-11-05 DIAGNOSIS — R809 Proteinuria, unspecified: Secondary | ICD-10-CM | POA: Diagnosis not present

## 2016-11-05 DIAGNOSIS — N183 Chronic kidney disease, stage 3 (moderate): Secondary | ICD-10-CM | POA: Diagnosis not present

## 2016-11-05 DIAGNOSIS — Z720 Tobacco use: Secondary | ICD-10-CM | POA: Diagnosis not present

## 2016-11-05 DIAGNOSIS — E785 Hyperlipidemia, unspecified: Secondary | ICD-10-CM | POA: Diagnosis not present

## 2016-11-05 DIAGNOSIS — E1129 Type 2 diabetes mellitus with other diabetic kidney complication: Secondary | ICD-10-CM | POA: Diagnosis not present

## 2016-11-05 DIAGNOSIS — I10 Essential (primary) hypertension: Secondary | ICD-10-CM | POA: Diagnosis not present

## 2016-11-17 DIAGNOSIS — Z1211 Encounter for screening for malignant neoplasm of colon: Secondary | ICD-10-CM | POA: Diagnosis not present

## 2016-11-17 DIAGNOSIS — K760 Fatty (change of) liver, not elsewhere classified: Secondary | ICD-10-CM | POA: Diagnosis not present

## 2016-11-17 DIAGNOSIS — Z86010 Personal history of colonic polyps: Secondary | ICD-10-CM | POA: Diagnosis not present

## 2016-12-08 DIAGNOSIS — Z23 Encounter for immunization: Secondary | ICD-10-CM | POA: Diagnosis not present

## 2016-12-08 DIAGNOSIS — I129 Hypertensive chronic kidney disease with stage 1 through stage 4 chronic kidney disease, or unspecified chronic kidney disease: Secondary | ICD-10-CM | POA: Diagnosis not present

## 2016-12-08 DIAGNOSIS — E782 Mixed hyperlipidemia: Secondary | ICD-10-CM | POA: Diagnosis not present

## 2016-12-08 DIAGNOSIS — E1140 Type 2 diabetes mellitus with diabetic neuropathy, unspecified: Secondary | ICD-10-CM | POA: Diagnosis not present

## 2016-12-17 DIAGNOSIS — Z720 Tobacco use: Secondary | ICD-10-CM | POA: Diagnosis not present

## 2016-12-17 DIAGNOSIS — E1140 Type 2 diabetes mellitus with diabetic neuropathy, unspecified: Secondary | ICD-10-CM | POA: Diagnosis not present

## 2016-12-17 DIAGNOSIS — Z139 Encounter for screening, unspecified: Secondary | ICD-10-CM | POA: Diagnosis not present

## 2016-12-17 DIAGNOSIS — I10 Essential (primary) hypertension: Secondary | ICD-10-CM | POA: Diagnosis not present

## 2016-12-17 DIAGNOSIS — E785 Hyperlipidemia, unspecified: Secondary | ICD-10-CM | POA: Diagnosis not present

## 2016-12-17 DIAGNOSIS — F419 Anxiety disorder, unspecified: Secondary | ICD-10-CM | POA: Diagnosis not present

## 2016-12-17 DIAGNOSIS — E663 Overweight: Secondary | ICD-10-CM | POA: Diagnosis not present

## 2016-12-17 DIAGNOSIS — F339 Major depressive disorder, recurrent, unspecified: Secondary | ICD-10-CM | POA: Diagnosis not present

## 2016-12-17 DIAGNOSIS — I129 Hypertensive chronic kidney disease with stage 1 through stage 4 chronic kidney disease, or unspecified chronic kidney disease: Secondary | ICD-10-CM | POA: Diagnosis not present

## 2016-12-17 DIAGNOSIS — E782 Mixed hyperlipidemia: Secondary | ICD-10-CM | POA: Diagnosis not present

## 2016-12-17 DIAGNOSIS — N183 Chronic kidney disease, stage 3 (moderate): Secondary | ICD-10-CM | POA: Diagnosis not present

## 2016-12-17 DIAGNOSIS — Z1331 Encounter for screening for depression: Secondary | ICD-10-CM | POA: Diagnosis not present

## 2016-12-22 DIAGNOSIS — M25642 Stiffness of left hand, not elsewhere classified: Secondary | ICD-10-CM | POA: Diagnosis not present

## 2016-12-22 DIAGNOSIS — M25542 Pain in joints of left hand: Secondary | ICD-10-CM | POA: Diagnosis not present

## 2016-12-22 DIAGNOSIS — M25541 Pain in joints of right hand: Secondary | ICD-10-CM | POA: Diagnosis not present

## 2016-12-22 DIAGNOSIS — M25641 Stiffness of right hand, not elsewhere classified: Secondary | ICD-10-CM | POA: Diagnosis not present

## 2016-12-24 DIAGNOSIS — E785 Hyperlipidemia, unspecified: Secondary | ICD-10-CM | POA: Diagnosis not present

## 2016-12-24 DIAGNOSIS — K5730 Diverticulosis of large intestine without perforation or abscess without bleeding: Secondary | ICD-10-CM | POA: Diagnosis not present

## 2016-12-24 DIAGNOSIS — Z86010 Personal history of colonic polyps: Secondary | ICD-10-CM | POA: Diagnosis not present

## 2016-12-24 DIAGNOSIS — D125 Benign neoplasm of sigmoid colon: Secondary | ICD-10-CM | POA: Diagnosis not present

## 2016-12-24 DIAGNOSIS — F418 Other specified anxiety disorders: Secondary | ICD-10-CM | POA: Diagnosis not present

## 2016-12-24 DIAGNOSIS — K648 Other hemorrhoids: Secondary | ICD-10-CM | POA: Diagnosis not present

## 2016-12-24 DIAGNOSIS — Z1211 Encounter for screening for malignant neoplasm of colon: Secondary | ICD-10-CM | POA: Diagnosis not present

## 2016-12-24 DIAGNOSIS — E119 Type 2 diabetes mellitus without complications: Secondary | ICD-10-CM | POA: Diagnosis not present

## 2016-12-24 DIAGNOSIS — K760 Fatty (change of) liver, not elsewhere classified: Secondary | ICD-10-CM | POA: Diagnosis not present

## 2016-12-24 DIAGNOSIS — I10 Essential (primary) hypertension: Secondary | ICD-10-CM | POA: Diagnosis not present

## 2016-12-24 DIAGNOSIS — K219 Gastro-esophageal reflux disease without esophagitis: Secondary | ICD-10-CM | POA: Diagnosis not present

## 2016-12-24 DIAGNOSIS — K635 Polyp of colon: Secondary | ICD-10-CM | POA: Diagnosis not present

## 2016-12-24 HISTORY — PX: COLONOSCOPY: SHX5424

## 2016-12-29 DIAGNOSIS — M25541 Pain in joints of right hand: Secondary | ICD-10-CM | POA: Diagnosis not present

## 2016-12-29 DIAGNOSIS — M25642 Stiffness of left hand, not elsewhere classified: Secondary | ICD-10-CM | POA: Diagnosis not present

## 2016-12-29 DIAGNOSIS — M25641 Stiffness of right hand, not elsewhere classified: Secondary | ICD-10-CM | POA: Diagnosis not present

## 2016-12-29 DIAGNOSIS — M25542 Pain in joints of left hand: Secondary | ICD-10-CM | POA: Diagnosis not present

## 2016-12-31 DIAGNOSIS — M25541 Pain in joints of right hand: Secondary | ICD-10-CM | POA: Diagnosis not present

## 2016-12-31 DIAGNOSIS — M25641 Stiffness of right hand, not elsewhere classified: Secondary | ICD-10-CM | POA: Diagnosis not present

## 2016-12-31 DIAGNOSIS — M25542 Pain in joints of left hand: Secondary | ICD-10-CM | POA: Diagnosis not present

## 2016-12-31 DIAGNOSIS — M25642 Stiffness of left hand, not elsewhere classified: Secondary | ICD-10-CM | POA: Diagnosis not present

## 2017-01-04 DIAGNOSIS — M25541 Pain in joints of right hand: Secondary | ICD-10-CM | POA: Diagnosis not present

## 2017-01-04 DIAGNOSIS — M25641 Stiffness of right hand, not elsewhere classified: Secondary | ICD-10-CM | POA: Diagnosis not present

## 2017-01-04 DIAGNOSIS — M25642 Stiffness of left hand, not elsewhere classified: Secondary | ICD-10-CM | POA: Diagnosis not present

## 2017-01-04 DIAGNOSIS — M25542 Pain in joints of left hand: Secondary | ICD-10-CM | POA: Diagnosis not present

## 2017-01-12 DIAGNOSIS — M25641 Stiffness of right hand, not elsewhere classified: Secondary | ICD-10-CM | POA: Diagnosis not present

## 2017-01-12 DIAGNOSIS — R944 Abnormal results of kidney function studies: Secondary | ICD-10-CM | POA: Diagnosis not present

## 2017-01-12 DIAGNOSIS — M25542 Pain in joints of left hand: Secondary | ICD-10-CM | POA: Diagnosis not present

## 2017-01-12 DIAGNOSIS — R748 Abnormal levels of other serum enzymes: Secondary | ICD-10-CM | POA: Diagnosis not present

## 2017-01-12 DIAGNOSIS — M25541 Pain in joints of right hand: Secondary | ICD-10-CM | POA: Diagnosis not present

## 2017-01-12 DIAGNOSIS — M25642 Stiffness of left hand, not elsewhere classified: Secondary | ICD-10-CM | POA: Diagnosis not present

## 2017-01-14 DIAGNOSIS — M25541 Pain in joints of right hand: Secondary | ICD-10-CM | POA: Diagnosis not present

## 2017-01-14 DIAGNOSIS — E1140 Type 2 diabetes mellitus with diabetic neuropathy, unspecified: Secondary | ICD-10-CM | POA: Diagnosis not present

## 2017-01-14 DIAGNOSIS — M25642 Stiffness of left hand, not elsewhere classified: Secondary | ICD-10-CM | POA: Diagnosis not present

## 2017-01-14 DIAGNOSIS — M25542 Pain in joints of left hand: Secondary | ICD-10-CM | POA: Diagnosis not present

## 2017-01-14 DIAGNOSIS — M25641 Stiffness of right hand, not elsewhere classified: Secondary | ICD-10-CM | POA: Diagnosis not present

## 2017-01-19 DIAGNOSIS — M25541 Pain in joints of right hand: Secondary | ICD-10-CM | POA: Diagnosis not present

## 2017-01-19 DIAGNOSIS — M25542 Pain in joints of left hand: Secondary | ICD-10-CM | POA: Diagnosis not present

## 2017-01-19 DIAGNOSIS — M25642 Stiffness of left hand, not elsewhere classified: Secondary | ICD-10-CM | POA: Diagnosis not present

## 2017-01-19 DIAGNOSIS — M25641 Stiffness of right hand, not elsewhere classified: Secondary | ICD-10-CM | POA: Diagnosis not present

## 2017-01-20 DIAGNOSIS — H524 Presbyopia: Secondary | ICD-10-CM | POA: Diagnosis not present

## 2017-01-20 DIAGNOSIS — E119 Type 2 diabetes mellitus without complications: Secondary | ICD-10-CM | POA: Diagnosis not present

## 2017-01-21 DIAGNOSIS — M25541 Pain in joints of right hand: Secondary | ICD-10-CM | POA: Diagnosis not present

## 2017-01-21 DIAGNOSIS — M25641 Stiffness of right hand, not elsewhere classified: Secondary | ICD-10-CM | POA: Diagnosis not present

## 2017-01-21 DIAGNOSIS — M25642 Stiffness of left hand, not elsewhere classified: Secondary | ICD-10-CM | POA: Diagnosis not present

## 2017-01-21 DIAGNOSIS — M25542 Pain in joints of left hand: Secondary | ICD-10-CM | POA: Diagnosis not present

## 2017-01-24 DIAGNOSIS — M25542 Pain in joints of left hand: Secondary | ICD-10-CM | POA: Diagnosis not present

## 2017-01-24 DIAGNOSIS — M25641 Stiffness of right hand, not elsewhere classified: Secondary | ICD-10-CM | POA: Diagnosis not present

## 2017-01-24 DIAGNOSIS — M25642 Stiffness of left hand, not elsewhere classified: Secondary | ICD-10-CM | POA: Diagnosis not present

## 2017-01-24 DIAGNOSIS — M25541 Pain in joints of right hand: Secondary | ICD-10-CM | POA: Diagnosis not present

## 2017-01-26 DIAGNOSIS — M25641 Stiffness of right hand, not elsewhere classified: Secondary | ICD-10-CM | POA: Diagnosis not present

## 2017-01-26 DIAGNOSIS — M25642 Stiffness of left hand, not elsewhere classified: Secondary | ICD-10-CM | POA: Diagnosis not present

## 2017-01-26 DIAGNOSIS — M25542 Pain in joints of left hand: Secondary | ICD-10-CM | POA: Diagnosis not present

## 2017-01-26 DIAGNOSIS — M25541 Pain in joints of right hand: Secondary | ICD-10-CM | POA: Diagnosis not present

## 2017-02-02 DIAGNOSIS — M25542 Pain in joints of left hand: Secondary | ICD-10-CM | POA: Diagnosis not present

## 2017-02-02 DIAGNOSIS — M25641 Stiffness of right hand, not elsewhere classified: Secondary | ICD-10-CM | POA: Diagnosis not present

## 2017-02-02 DIAGNOSIS — M25541 Pain in joints of right hand: Secondary | ICD-10-CM | POA: Diagnosis not present

## 2017-02-02 DIAGNOSIS — M25642 Stiffness of left hand, not elsewhere classified: Secondary | ICD-10-CM | POA: Diagnosis not present

## 2017-02-04 DIAGNOSIS — M25541 Pain in joints of right hand: Secondary | ICD-10-CM | POA: Diagnosis not present

## 2017-02-04 DIAGNOSIS — M25542 Pain in joints of left hand: Secondary | ICD-10-CM | POA: Diagnosis not present

## 2017-02-04 DIAGNOSIS — M25641 Stiffness of right hand, not elsewhere classified: Secondary | ICD-10-CM | POA: Diagnosis not present

## 2017-02-04 DIAGNOSIS — M25642 Stiffness of left hand, not elsewhere classified: Secondary | ICD-10-CM | POA: Diagnosis not present

## 2017-02-19 DIAGNOSIS — E1140 Type 2 diabetes mellitus with diabetic neuropathy, unspecified: Secondary | ICD-10-CM | POA: Diagnosis not present

## 2017-02-24 DIAGNOSIS — M25532 Pain in left wrist: Secondary | ICD-10-CM | POA: Diagnosis not present

## 2017-02-24 DIAGNOSIS — M654 Radial styloid tenosynovitis [de Quervain]: Secondary | ICD-10-CM | POA: Diagnosis not present

## 2017-03-23 DIAGNOSIS — E1140 Type 2 diabetes mellitus with diabetic neuropathy, unspecified: Secondary | ICD-10-CM | POA: Diagnosis not present

## 2017-03-31 DIAGNOSIS — M654 Radial styloid tenosynovitis [de Quervain]: Secondary | ICD-10-CM | POA: Diagnosis not present

## 2017-03-31 DIAGNOSIS — M1812 Unilateral primary osteoarthritis of first carpometacarpal joint, left hand: Secondary | ICD-10-CM | POA: Diagnosis not present

## 2017-04-07 DIAGNOSIS — M654 Radial styloid tenosynovitis [de Quervain]: Secondary | ICD-10-CM | POA: Diagnosis not present

## 2017-04-07 DIAGNOSIS — M1812 Unilateral primary osteoarthritis of first carpometacarpal joint, left hand: Secondary | ICD-10-CM | POA: Diagnosis not present

## 2017-04-12 DIAGNOSIS — E1140 Type 2 diabetes mellitus with diabetic neuropathy, unspecified: Secondary | ICD-10-CM | POA: Diagnosis not present

## 2017-04-12 DIAGNOSIS — E1169 Type 2 diabetes mellitus with other specified complication: Secondary | ICD-10-CM | POA: Diagnosis not present

## 2017-04-12 DIAGNOSIS — I129 Hypertensive chronic kidney disease with stage 1 through stage 4 chronic kidney disease, or unspecified chronic kidney disease: Secondary | ICD-10-CM | POA: Diagnosis not present

## 2017-04-19 DIAGNOSIS — E1169 Type 2 diabetes mellitus with other specified complication: Secondary | ICD-10-CM | POA: Diagnosis not present

## 2017-04-19 DIAGNOSIS — I129 Hypertensive chronic kidney disease with stage 1 through stage 4 chronic kidney disease, or unspecified chronic kidney disease: Secondary | ICD-10-CM | POA: Diagnosis not present

## 2017-04-19 DIAGNOSIS — E1140 Type 2 diabetes mellitus with diabetic neuropathy, unspecified: Secondary | ICD-10-CM | POA: Diagnosis not present

## 2017-04-19 DIAGNOSIS — E782 Mixed hyperlipidemia: Secondary | ICD-10-CM | POA: Diagnosis not present

## 2017-04-19 DIAGNOSIS — Z139 Encounter for screening, unspecified: Secondary | ICD-10-CM | POA: Diagnosis not present

## 2017-04-20 DIAGNOSIS — R0602 Shortness of breath: Secondary | ICD-10-CM | POA: Diagnosis not present

## 2017-04-20 DIAGNOSIS — R05 Cough: Secondary | ICD-10-CM | POA: Diagnosis not present

## 2017-04-20 DIAGNOSIS — J301 Allergic rhinitis due to pollen: Secondary | ICD-10-CM | POA: Diagnosis not present

## 2017-04-20 DIAGNOSIS — J3089 Other allergic rhinitis: Secondary | ICD-10-CM | POA: Diagnosis not present

## 2017-04-23 DIAGNOSIS — E1169 Type 2 diabetes mellitus with other specified complication: Secondary | ICD-10-CM | POA: Diagnosis not present

## 2017-04-23 DIAGNOSIS — E782 Mixed hyperlipidemia: Secondary | ICD-10-CM | POA: Diagnosis not present

## 2017-04-30 DIAGNOSIS — M654 Radial styloid tenosynovitis [de Quervain]: Secondary | ICD-10-CM | POA: Diagnosis not present

## 2017-04-30 DIAGNOSIS — M1812 Unilateral primary osteoarthritis of first carpometacarpal joint, left hand: Secondary | ICD-10-CM | POA: Diagnosis not present

## 2017-05-18 DIAGNOSIS — Z1231 Encounter for screening mammogram for malignant neoplasm of breast: Secondary | ICD-10-CM | POA: Diagnosis not present

## 2017-05-26 DIAGNOSIS — M654 Radial styloid tenosynovitis [de Quervain]: Secondary | ICD-10-CM | POA: Diagnosis not present

## 2017-05-26 DIAGNOSIS — M1812 Unilateral primary osteoarthritis of first carpometacarpal joint, left hand: Secondary | ICD-10-CM | POA: Diagnosis not present

## 2017-07-05 DIAGNOSIS — I129 Hypertensive chronic kidney disease with stage 1 through stage 4 chronic kidney disease, or unspecified chronic kidney disease: Secondary | ICD-10-CM | POA: Diagnosis not present

## 2017-07-05 DIAGNOSIS — E1140 Type 2 diabetes mellitus with diabetic neuropathy, unspecified: Secondary | ICD-10-CM | POA: Diagnosis not present

## 2017-07-05 DIAGNOSIS — E1169 Type 2 diabetes mellitus with other specified complication: Secondary | ICD-10-CM | POA: Diagnosis not present

## 2017-07-06 DIAGNOSIS — E1169 Type 2 diabetes mellitus with other specified complication: Secondary | ICD-10-CM | POA: Diagnosis not present

## 2017-07-06 DIAGNOSIS — N183 Chronic kidney disease, stage 3 (moderate): Secondary | ICD-10-CM | POA: Diagnosis not present

## 2017-07-06 DIAGNOSIS — I129 Hypertensive chronic kidney disease with stage 1 through stage 4 chronic kidney disease, or unspecified chronic kidney disease: Secondary | ICD-10-CM | POA: Diagnosis not present

## 2017-07-06 DIAGNOSIS — E782 Mixed hyperlipidemia: Secondary | ICD-10-CM | POA: Diagnosis not present

## 2017-07-12 DIAGNOSIS — N183 Chronic kidney disease, stage 3 (moderate): Secondary | ICD-10-CM | POA: Diagnosis not present

## 2017-07-12 DIAGNOSIS — I129 Hypertensive chronic kidney disease with stage 1 through stage 4 chronic kidney disease, or unspecified chronic kidney disease: Secondary | ICD-10-CM | POA: Diagnosis not present

## 2017-07-12 DIAGNOSIS — Z139 Encounter for screening, unspecified: Secondary | ICD-10-CM | POA: Diagnosis not present

## 2017-07-12 DIAGNOSIS — E782 Mixed hyperlipidemia: Secondary | ICD-10-CM | POA: Diagnosis not present

## 2017-07-12 DIAGNOSIS — E1169 Type 2 diabetes mellitus with other specified complication: Secondary | ICD-10-CM | POA: Diagnosis not present

## 2017-08-06 DIAGNOSIS — N183 Chronic kidney disease, stage 3 (moderate): Secondary | ICD-10-CM | POA: Diagnosis not present

## 2017-08-06 DIAGNOSIS — E1169 Type 2 diabetes mellitus with other specified complication: Secondary | ICD-10-CM | POA: Diagnosis not present

## 2017-08-26 DIAGNOSIS — F17200 Nicotine dependence, unspecified, uncomplicated: Secondary | ICD-10-CM | POA: Diagnosis not present

## 2017-08-26 DIAGNOSIS — N183 Chronic kidney disease, stage 3 (moderate): Secondary | ICD-10-CM | POA: Diagnosis not present

## 2017-08-26 DIAGNOSIS — I129 Hypertensive chronic kidney disease with stage 1 through stage 4 chronic kidney disease, or unspecified chronic kidney disease: Secondary | ICD-10-CM | POA: Diagnosis not present

## 2017-08-26 DIAGNOSIS — Z1211 Encounter for screening for malignant neoplasm of colon: Secondary | ICD-10-CM | POA: Diagnosis not present

## 2017-08-26 DIAGNOSIS — Z1272 Encounter for screening for malignant neoplasm of vagina: Secondary | ICD-10-CM | POA: Diagnosis not present

## 2017-08-26 DIAGNOSIS — E1140 Type 2 diabetes mellitus with diabetic neuropathy, unspecified: Secondary | ICD-10-CM | POA: Diagnosis not present

## 2017-08-26 DIAGNOSIS — J449 Chronic obstructive pulmonary disease, unspecified: Secondary | ICD-10-CM | POA: Diagnosis not present

## 2017-08-26 DIAGNOSIS — Z87898 Personal history of other specified conditions: Secondary | ICD-10-CM | POA: Diagnosis not present

## 2017-09-05 DIAGNOSIS — E1140 Type 2 diabetes mellitus with diabetic neuropathy, unspecified: Secondary | ICD-10-CM | POA: Diagnosis not present

## 2017-09-05 DIAGNOSIS — J449 Chronic obstructive pulmonary disease, unspecified: Secondary | ICD-10-CM | POA: Diagnosis not present

## 2017-09-05 DIAGNOSIS — I129 Hypertensive chronic kidney disease with stage 1 through stage 4 chronic kidney disease, or unspecified chronic kidney disease: Secondary | ICD-10-CM | POA: Diagnosis not present

## 2017-09-05 DIAGNOSIS — N183 Chronic kidney disease, stage 3 (moderate): Secondary | ICD-10-CM | POA: Diagnosis not present

## 2017-09-13 DIAGNOSIS — N183 Chronic kidney disease, stage 3 (moderate): Secondary | ICD-10-CM | POA: Diagnosis not present

## 2017-09-13 DIAGNOSIS — R809 Proteinuria, unspecified: Secondary | ICD-10-CM | POA: Diagnosis not present

## 2017-09-13 DIAGNOSIS — E785 Hyperlipidemia, unspecified: Secondary | ICD-10-CM | POA: Diagnosis not present

## 2017-09-13 DIAGNOSIS — Z720 Tobacco use: Secondary | ICD-10-CM | POA: Diagnosis not present

## 2017-09-13 DIAGNOSIS — E1122 Type 2 diabetes mellitus with diabetic chronic kidney disease: Secondary | ICD-10-CM | POA: Diagnosis not present

## 2017-09-13 DIAGNOSIS — I129 Hypertensive chronic kidney disease with stage 1 through stage 4 chronic kidney disease, or unspecified chronic kidney disease: Secondary | ICD-10-CM | POA: Diagnosis not present

## 2017-09-14 DIAGNOSIS — N183 Chronic kidney disease, stage 3 (moderate): Secondary | ICD-10-CM | POA: Diagnosis not present

## 2017-09-14 DIAGNOSIS — D509 Iron deficiency anemia, unspecified: Secondary | ICD-10-CM | POA: Diagnosis not present

## 2017-10-06 DIAGNOSIS — E1140 Type 2 diabetes mellitus with diabetic neuropathy, unspecified: Secondary | ICD-10-CM | POA: Diagnosis not present

## 2017-10-06 DIAGNOSIS — N183 Chronic kidney disease, stage 3 (moderate): Secondary | ICD-10-CM | POA: Diagnosis not present

## 2017-10-06 DIAGNOSIS — J449 Chronic obstructive pulmonary disease, unspecified: Secondary | ICD-10-CM | POA: Diagnosis not present

## 2017-10-06 DIAGNOSIS — I129 Hypertensive chronic kidney disease with stage 1 through stage 4 chronic kidney disease, or unspecified chronic kidney disease: Secondary | ICD-10-CM | POA: Diagnosis not present

## 2017-11-05 DIAGNOSIS — J449 Chronic obstructive pulmonary disease, unspecified: Secondary | ICD-10-CM | POA: Diagnosis not present

## 2017-11-05 DIAGNOSIS — E1140 Type 2 diabetes mellitus with diabetic neuropathy, unspecified: Secondary | ICD-10-CM | POA: Diagnosis not present

## 2017-11-05 DIAGNOSIS — I129 Hypertensive chronic kidney disease with stage 1 through stage 4 chronic kidney disease, or unspecified chronic kidney disease: Secondary | ICD-10-CM | POA: Diagnosis not present

## 2017-11-05 DIAGNOSIS — N183 Chronic kidney disease, stage 3 (moderate): Secondary | ICD-10-CM | POA: Diagnosis not present

## 2017-11-12 DIAGNOSIS — E1169 Type 2 diabetes mellitus with other specified complication: Secondary | ICD-10-CM | POA: Diagnosis not present

## 2017-11-12 DIAGNOSIS — I129 Hypertensive chronic kidney disease with stage 1 through stage 4 chronic kidney disease, or unspecified chronic kidney disease: Secondary | ICD-10-CM | POA: Diagnosis not present

## 2017-11-12 DIAGNOSIS — E1140 Type 2 diabetes mellitus with diabetic neuropathy, unspecified: Secondary | ICD-10-CM | POA: Diagnosis not present

## 2017-11-19 DIAGNOSIS — Z139 Encounter for screening, unspecified: Secondary | ICD-10-CM | POA: Diagnosis not present

## 2017-11-19 DIAGNOSIS — Z23 Encounter for immunization: Secondary | ICD-10-CM | POA: Diagnosis not present

## 2017-11-19 DIAGNOSIS — N183 Chronic kidney disease, stage 3 (moderate): Secondary | ICD-10-CM | POA: Diagnosis not present

## 2017-11-19 DIAGNOSIS — Z0000 Encounter for general adult medical examination without abnormal findings: Secondary | ICD-10-CM | POA: Diagnosis not present

## 2017-11-19 DIAGNOSIS — I129 Hypertensive chronic kidney disease with stage 1 through stage 4 chronic kidney disease, or unspecified chronic kidney disease: Secondary | ICD-10-CM | POA: Diagnosis not present

## 2017-11-19 DIAGNOSIS — F17200 Nicotine dependence, unspecified, uncomplicated: Secondary | ICD-10-CM | POA: Diagnosis not present

## 2017-11-19 DIAGNOSIS — Z1331 Encounter for screening for depression: Secondary | ICD-10-CM | POA: Diagnosis not present

## 2017-11-19 DIAGNOSIS — E782 Mixed hyperlipidemia: Secondary | ICD-10-CM | POA: Diagnosis not present

## 2017-11-19 DIAGNOSIS — E1169 Type 2 diabetes mellitus with other specified complication: Secondary | ICD-10-CM | POA: Diagnosis not present

## 2017-12-06 DIAGNOSIS — E1169 Type 2 diabetes mellitus with other specified complication: Secondary | ICD-10-CM | POA: Diagnosis not present

## 2017-12-06 DIAGNOSIS — E782 Mixed hyperlipidemia: Secondary | ICD-10-CM | POA: Diagnosis not present

## 2017-12-06 DIAGNOSIS — I129 Hypertensive chronic kidney disease with stage 1 through stage 4 chronic kidney disease, or unspecified chronic kidney disease: Secondary | ICD-10-CM | POA: Diagnosis not present

## 2017-12-06 DIAGNOSIS — N183 Chronic kidney disease, stage 3 (moderate): Secondary | ICD-10-CM | POA: Diagnosis not present

## 2018-01-06 DIAGNOSIS — I129 Hypertensive chronic kidney disease with stage 1 through stage 4 chronic kidney disease, or unspecified chronic kidney disease: Secondary | ICD-10-CM | POA: Diagnosis not present

## 2018-01-06 DIAGNOSIS — E782 Mixed hyperlipidemia: Secondary | ICD-10-CM | POA: Diagnosis not present

## 2018-01-06 DIAGNOSIS — N183 Chronic kidney disease, stage 3 (moderate): Secondary | ICD-10-CM | POA: Diagnosis not present

## 2018-01-06 DIAGNOSIS — E1169 Type 2 diabetes mellitus with other specified complication: Secondary | ICD-10-CM | POA: Diagnosis not present

## 2018-01-21 DIAGNOSIS — H0288A Meibomian gland dysfunction right eye, upper and lower eyelids: Secondary | ICD-10-CM | POA: Diagnosis not present

## 2018-01-21 DIAGNOSIS — H0288B Meibomian gland dysfunction left eye, upper and lower eyelids: Secondary | ICD-10-CM | POA: Diagnosis not present

## 2018-01-21 DIAGNOSIS — E119 Type 2 diabetes mellitus without complications: Secondary | ICD-10-CM | POA: Diagnosis not present

## 2018-01-24 DIAGNOSIS — Z6827 Body mass index (BMI) 27.0-27.9, adult: Secondary | ICD-10-CM | POA: Diagnosis not present

## 2018-01-24 DIAGNOSIS — J440 Chronic obstructive pulmonary disease with acute lower respiratory infection: Secondary | ICD-10-CM | POA: Diagnosis not present

## 2018-01-24 DIAGNOSIS — J209 Acute bronchitis, unspecified: Secondary | ICD-10-CM | POA: Diagnosis not present

## 2018-01-25 ENCOUNTER — Other Ambulatory Visit: Payer: Self-pay

## 2018-02-05 DIAGNOSIS — E1169 Type 2 diabetes mellitus with other specified complication: Secondary | ICD-10-CM | POA: Diagnosis not present

## 2018-02-05 DIAGNOSIS — J440 Chronic obstructive pulmonary disease with acute lower respiratory infection: Secondary | ICD-10-CM | POA: Diagnosis not present

## 2018-02-05 DIAGNOSIS — E782 Mixed hyperlipidemia: Secondary | ICD-10-CM | POA: Diagnosis not present

## 2018-02-05 DIAGNOSIS — I129 Hypertensive chronic kidney disease with stage 1 through stage 4 chronic kidney disease, or unspecified chronic kidney disease: Secondary | ICD-10-CM | POA: Diagnosis not present

## 2018-03-08 DIAGNOSIS — J440 Chronic obstructive pulmonary disease with acute lower respiratory infection: Secondary | ICD-10-CM | POA: Diagnosis not present

## 2018-03-08 DIAGNOSIS — I129 Hypertensive chronic kidney disease with stage 1 through stage 4 chronic kidney disease, or unspecified chronic kidney disease: Secondary | ICD-10-CM | POA: Diagnosis not present

## 2018-03-08 DIAGNOSIS — E782 Mixed hyperlipidemia: Secondary | ICD-10-CM | POA: Diagnosis not present

## 2018-03-08 DIAGNOSIS — E1169 Type 2 diabetes mellitus with other specified complication: Secondary | ICD-10-CM | POA: Diagnosis not present

## 2018-03-14 DIAGNOSIS — E1140 Type 2 diabetes mellitus with diabetic neuropathy, unspecified: Secondary | ICD-10-CM | POA: Diagnosis not present

## 2018-03-14 DIAGNOSIS — E1169 Type 2 diabetes mellitus with other specified complication: Secondary | ICD-10-CM | POA: Diagnosis not present

## 2018-03-14 DIAGNOSIS — I129 Hypertensive chronic kidney disease with stage 1 through stage 4 chronic kidney disease, or unspecified chronic kidney disease: Secondary | ICD-10-CM | POA: Diagnosis not present

## 2018-03-21 DIAGNOSIS — E782 Mixed hyperlipidemia: Secondary | ICD-10-CM | POA: Diagnosis not present

## 2018-03-21 DIAGNOSIS — Z139 Encounter for screening, unspecified: Secondary | ICD-10-CM | POA: Diagnosis not present

## 2018-03-21 DIAGNOSIS — N183 Chronic kidney disease, stage 3 (moderate): Secondary | ICD-10-CM | POA: Diagnosis not present

## 2018-03-21 DIAGNOSIS — I129 Hypertensive chronic kidney disease with stage 1 through stage 4 chronic kidney disease, or unspecified chronic kidney disease: Secondary | ICD-10-CM | POA: Diagnosis not present

## 2018-03-21 DIAGNOSIS — E1169 Type 2 diabetes mellitus with other specified complication: Secondary | ICD-10-CM | POA: Diagnosis not present

## 2018-03-21 DIAGNOSIS — F17200 Nicotine dependence, unspecified, uncomplicated: Secondary | ICD-10-CM | POA: Diagnosis not present

## 2018-04-08 DIAGNOSIS — E1169 Type 2 diabetes mellitus with other specified complication: Secondary | ICD-10-CM | POA: Diagnosis not present

## 2018-04-08 DIAGNOSIS — N183 Chronic kidney disease, stage 3 (moderate): Secondary | ICD-10-CM | POA: Diagnosis not present

## 2018-04-08 DIAGNOSIS — E782 Mixed hyperlipidemia: Secondary | ICD-10-CM | POA: Diagnosis not present

## 2018-04-08 DIAGNOSIS — I129 Hypertensive chronic kidney disease with stage 1 through stage 4 chronic kidney disease, or unspecified chronic kidney disease: Secondary | ICD-10-CM | POA: Diagnosis not present

## 2018-05-06 DIAGNOSIS — E782 Mixed hyperlipidemia: Secondary | ICD-10-CM | POA: Diagnosis not present

## 2018-05-06 DIAGNOSIS — I129 Hypertensive chronic kidney disease with stage 1 through stage 4 chronic kidney disease, or unspecified chronic kidney disease: Secondary | ICD-10-CM | POA: Diagnosis not present

## 2018-05-06 DIAGNOSIS — E1169 Type 2 diabetes mellitus with other specified complication: Secondary | ICD-10-CM | POA: Diagnosis not present

## 2018-05-06 DIAGNOSIS — N183 Chronic kidney disease, stage 3 (moderate): Secondary | ICD-10-CM | POA: Diagnosis not present

## 2018-05-09 DIAGNOSIS — N183 Chronic kidney disease, stage 3 (moderate): Secondary | ICD-10-CM | POA: Diagnosis not present

## 2018-05-09 DIAGNOSIS — R809 Proteinuria, unspecified: Secondary | ICD-10-CM | POA: Diagnosis not present

## 2018-05-09 DIAGNOSIS — D631 Anemia in chronic kidney disease: Secondary | ICD-10-CM | POA: Diagnosis not present

## 2018-05-09 DIAGNOSIS — E669 Obesity, unspecified: Secondary | ICD-10-CM | POA: Diagnosis not present

## 2018-05-09 DIAGNOSIS — Z720 Tobacco use: Secondary | ICD-10-CM | POA: Diagnosis not present

## 2018-05-09 DIAGNOSIS — I129 Hypertensive chronic kidney disease with stage 1 through stage 4 chronic kidney disease, or unspecified chronic kidney disease: Secondary | ICD-10-CM | POA: Diagnosis not present

## 2018-05-09 DIAGNOSIS — F329 Major depressive disorder, single episode, unspecified: Secondary | ICD-10-CM | POA: Diagnosis not present

## 2018-05-09 DIAGNOSIS — M1990 Unspecified osteoarthritis, unspecified site: Secondary | ICD-10-CM | POA: Diagnosis not present

## 2018-05-23 DIAGNOSIS — Z1231 Encounter for screening mammogram for malignant neoplasm of breast: Secondary | ICD-10-CM | POA: Diagnosis not present

## 2018-06-07 DIAGNOSIS — I129 Hypertensive chronic kidney disease with stage 1 through stage 4 chronic kidney disease, or unspecified chronic kidney disease: Secondary | ICD-10-CM | POA: Diagnosis not present

## 2018-06-07 DIAGNOSIS — E782 Mixed hyperlipidemia: Secondary | ICD-10-CM | POA: Diagnosis not present

## 2018-06-07 DIAGNOSIS — E1169 Type 2 diabetes mellitus with other specified complication: Secondary | ICD-10-CM | POA: Diagnosis not present

## 2018-06-07 DIAGNOSIS — N183 Chronic kidney disease, stage 3 (moderate): Secondary | ICD-10-CM | POA: Diagnosis not present

## 2018-07-07 DIAGNOSIS — I129 Hypertensive chronic kidney disease with stage 1 through stage 4 chronic kidney disease, or unspecified chronic kidney disease: Secondary | ICD-10-CM | POA: Diagnosis not present

## 2018-07-07 DIAGNOSIS — E782 Mixed hyperlipidemia: Secondary | ICD-10-CM | POA: Diagnosis not present

## 2018-07-07 DIAGNOSIS — E1169 Type 2 diabetes mellitus with other specified complication: Secondary | ICD-10-CM | POA: Diagnosis not present

## 2018-07-07 DIAGNOSIS — N183 Chronic kidney disease, stage 3 (moderate): Secondary | ICD-10-CM | POA: Diagnosis not present

## 2018-07-18 DIAGNOSIS — E1169 Type 2 diabetes mellitus with other specified complication: Secondary | ICD-10-CM | POA: Diagnosis not present

## 2018-07-18 DIAGNOSIS — E1140 Type 2 diabetes mellitus with diabetic neuropathy, unspecified: Secondary | ICD-10-CM | POA: Diagnosis not present

## 2018-07-18 DIAGNOSIS — I129 Hypertensive chronic kidney disease with stage 1 through stage 4 chronic kidney disease, or unspecified chronic kidney disease: Secondary | ICD-10-CM | POA: Diagnosis not present

## 2018-07-25 DIAGNOSIS — N183 Chronic kidney disease, stage 3 (moderate): Secondary | ICD-10-CM | POA: Diagnosis not present

## 2018-07-25 DIAGNOSIS — F17200 Nicotine dependence, unspecified, uncomplicated: Secondary | ICD-10-CM | POA: Diagnosis not present

## 2018-07-25 DIAGNOSIS — Z139 Encounter for screening, unspecified: Secondary | ICD-10-CM | POA: Diagnosis not present

## 2018-07-25 DIAGNOSIS — I129 Hypertensive chronic kidney disease with stage 1 through stage 4 chronic kidney disease, or unspecified chronic kidney disease: Secondary | ICD-10-CM | POA: Diagnosis not present

## 2018-07-25 DIAGNOSIS — E1169 Type 2 diabetes mellitus with other specified complication: Secondary | ICD-10-CM | POA: Diagnosis not present

## 2018-07-25 DIAGNOSIS — E782 Mixed hyperlipidemia: Secondary | ICD-10-CM | POA: Diagnosis not present

## 2018-07-25 DIAGNOSIS — Z1331 Encounter for screening for depression: Secondary | ICD-10-CM | POA: Diagnosis not present

## 2018-08-05 DIAGNOSIS — N183 Chronic kidney disease, stage 3 (moderate): Secondary | ICD-10-CM | POA: Diagnosis not present

## 2018-08-05 DIAGNOSIS — I129 Hypertensive chronic kidney disease with stage 1 through stage 4 chronic kidney disease, or unspecified chronic kidney disease: Secondary | ICD-10-CM | POA: Diagnosis not present

## 2018-08-05 DIAGNOSIS — E1169 Type 2 diabetes mellitus with other specified complication: Secondary | ICD-10-CM | POA: Diagnosis not present

## 2018-08-05 DIAGNOSIS — E782 Mixed hyperlipidemia: Secondary | ICD-10-CM | POA: Diagnosis not present

## 2018-09-06 DIAGNOSIS — I129 Hypertensive chronic kidney disease with stage 1 through stage 4 chronic kidney disease, or unspecified chronic kidney disease: Secondary | ICD-10-CM | POA: Diagnosis not present

## 2018-09-06 DIAGNOSIS — E1169 Type 2 diabetes mellitus with other specified complication: Secondary | ICD-10-CM | POA: Diagnosis not present

## 2018-09-06 DIAGNOSIS — E782 Mixed hyperlipidemia: Secondary | ICD-10-CM | POA: Diagnosis not present

## 2018-09-06 DIAGNOSIS — N183 Chronic kidney disease, stage 3 (moderate): Secondary | ICD-10-CM | POA: Diagnosis not present

## 2018-10-07 DIAGNOSIS — E782 Mixed hyperlipidemia: Secondary | ICD-10-CM | POA: Diagnosis not present

## 2018-10-07 DIAGNOSIS — I129 Hypertensive chronic kidney disease with stage 1 through stage 4 chronic kidney disease, or unspecified chronic kidney disease: Secondary | ICD-10-CM | POA: Diagnosis not present

## 2018-10-07 DIAGNOSIS — E1169 Type 2 diabetes mellitus with other specified complication: Secondary | ICD-10-CM | POA: Diagnosis not present

## 2018-10-07 DIAGNOSIS — N183 Chronic kidney disease, stage 3 (moderate): Secondary | ICD-10-CM | POA: Diagnosis not present

## 2018-11-07 DIAGNOSIS — E782 Mixed hyperlipidemia: Secondary | ICD-10-CM | POA: Diagnosis not present

## 2018-11-07 DIAGNOSIS — I129 Hypertensive chronic kidney disease with stage 1 through stage 4 chronic kidney disease, or unspecified chronic kidney disease: Secondary | ICD-10-CM | POA: Diagnosis not present

## 2018-11-07 DIAGNOSIS — E1169 Type 2 diabetes mellitus with other specified complication: Secondary | ICD-10-CM | POA: Diagnosis not present

## 2018-11-07 DIAGNOSIS — N183 Chronic kidney disease, stage 3 (moderate): Secondary | ICD-10-CM | POA: Diagnosis not present

## 2018-11-18 DIAGNOSIS — I129 Hypertensive chronic kidney disease with stage 1 through stage 4 chronic kidney disease, or unspecified chronic kidney disease: Secondary | ICD-10-CM | POA: Diagnosis not present

## 2018-11-18 DIAGNOSIS — E1169 Type 2 diabetes mellitus with other specified complication: Secondary | ICD-10-CM | POA: Diagnosis not present

## 2018-11-18 DIAGNOSIS — E1140 Type 2 diabetes mellitus with diabetic neuropathy, unspecified: Secondary | ICD-10-CM | POA: Diagnosis not present

## 2018-11-22 DIAGNOSIS — Z1331 Encounter for screening for depression: Secondary | ICD-10-CM | POA: Diagnosis not present

## 2018-11-22 DIAGNOSIS — Z1339 Encounter for screening examination for other mental health and behavioral disorders: Secondary | ICD-10-CM | POA: Diagnosis not present

## 2018-11-22 DIAGNOSIS — Z139 Encounter for screening, unspecified: Secondary | ICD-10-CM | POA: Diagnosis not present

## 2018-11-22 DIAGNOSIS — Z0000 Encounter for general adult medical examination without abnormal findings: Secondary | ICD-10-CM | POA: Diagnosis not present

## 2018-11-22 DIAGNOSIS — G4700 Insomnia, unspecified: Secondary | ICD-10-CM | POA: Diagnosis not present

## 2018-11-22 DIAGNOSIS — Z7189 Other specified counseling: Secondary | ICD-10-CM | POA: Diagnosis not present

## 2018-11-22 DIAGNOSIS — E782 Mixed hyperlipidemia: Secondary | ICD-10-CM | POA: Diagnosis not present

## 2018-11-22 DIAGNOSIS — E1169 Type 2 diabetes mellitus with other specified complication: Secondary | ICD-10-CM | POA: Diagnosis not present

## 2018-11-22 DIAGNOSIS — I129 Hypertensive chronic kidney disease with stage 1 through stage 4 chronic kidney disease, or unspecified chronic kidney disease: Secondary | ICD-10-CM | POA: Diagnosis not present

## 2018-11-25 DIAGNOSIS — E1169 Type 2 diabetes mellitus with other specified complication: Secondary | ICD-10-CM | POA: Diagnosis not present

## 2018-11-25 DIAGNOSIS — I129 Hypertensive chronic kidney disease with stage 1 through stage 4 chronic kidney disease, or unspecified chronic kidney disease: Secondary | ICD-10-CM | POA: Diagnosis not present

## 2018-11-25 DIAGNOSIS — G4700 Insomnia, unspecified: Secondary | ICD-10-CM | POA: Diagnosis not present

## 2018-11-25 DIAGNOSIS — E782 Mixed hyperlipidemia: Secondary | ICD-10-CM | POA: Diagnosis not present

## 2018-12-07 DIAGNOSIS — N183 Chronic kidney disease, stage 3 (moderate): Secondary | ICD-10-CM | POA: Diagnosis not present

## 2018-12-07 DIAGNOSIS — I129 Hypertensive chronic kidney disease with stage 1 through stage 4 chronic kidney disease, or unspecified chronic kidney disease: Secondary | ICD-10-CM | POA: Diagnosis not present

## 2018-12-07 DIAGNOSIS — E782 Mixed hyperlipidemia: Secondary | ICD-10-CM | POA: Diagnosis not present

## 2018-12-07 DIAGNOSIS — E1169 Type 2 diabetes mellitus with other specified complication: Secondary | ICD-10-CM | POA: Diagnosis not present

## 2019-01-05 DIAGNOSIS — Z23 Encounter for immunization: Secondary | ICD-10-CM | POA: Diagnosis not present

## 2019-01-06 DIAGNOSIS — I129 Hypertensive chronic kidney disease with stage 1 through stage 4 chronic kidney disease, or unspecified chronic kidney disease: Secondary | ICD-10-CM | POA: Diagnosis not present

## 2019-01-06 DIAGNOSIS — E1169 Type 2 diabetes mellitus with other specified complication: Secondary | ICD-10-CM | POA: Diagnosis not present

## 2019-01-06 DIAGNOSIS — E782 Mixed hyperlipidemia: Secondary | ICD-10-CM | POA: Diagnosis not present

## 2019-01-24 DIAGNOSIS — H0288A Meibomian gland dysfunction right eye, upper and lower eyelids: Secondary | ICD-10-CM | POA: Diagnosis not present

## 2019-01-24 DIAGNOSIS — H0288B Meibomian gland dysfunction left eye, upper and lower eyelids: Secondary | ICD-10-CM | POA: Diagnosis not present

## 2019-01-24 DIAGNOSIS — E119 Type 2 diabetes mellitus without complications: Secondary | ICD-10-CM | POA: Diagnosis not present

## 2019-01-24 DIAGNOSIS — H524 Presbyopia: Secondary | ICD-10-CM | POA: Diagnosis not present

## 2019-02-06 DIAGNOSIS — E782 Mixed hyperlipidemia: Secondary | ICD-10-CM | POA: Diagnosis not present

## 2019-02-06 DIAGNOSIS — E1169 Type 2 diabetes mellitus with other specified complication: Secondary | ICD-10-CM | POA: Diagnosis not present

## 2019-02-06 DIAGNOSIS — I129 Hypertensive chronic kidney disease with stage 1 through stage 4 chronic kidney disease, or unspecified chronic kidney disease: Secondary | ICD-10-CM | POA: Diagnosis not present

## 2019-03-30 DIAGNOSIS — I129 Hypertensive chronic kidney disease with stage 1 through stage 4 chronic kidney disease, or unspecified chronic kidney disease: Secondary | ICD-10-CM | POA: Diagnosis not present

## 2019-03-30 DIAGNOSIS — E1169 Type 2 diabetes mellitus with other specified complication: Secondary | ICD-10-CM | POA: Diagnosis not present

## 2019-03-30 DIAGNOSIS — F17200 Nicotine dependence, unspecified, uncomplicated: Secondary | ICD-10-CM | POA: Diagnosis not present

## 2019-03-30 DIAGNOSIS — E782 Mixed hyperlipidemia: Secondary | ICD-10-CM | POA: Diagnosis not present

## 2019-03-30 DIAGNOSIS — N183 Chronic kidney disease, stage 3 unspecified: Secondary | ICD-10-CM | POA: Diagnosis not present

## 2019-04-07 DIAGNOSIS — I129 Hypertensive chronic kidney disease with stage 1 through stage 4 chronic kidney disease, or unspecified chronic kidney disease: Secondary | ICD-10-CM | POA: Diagnosis not present

## 2019-04-07 DIAGNOSIS — E782 Mixed hyperlipidemia: Secondary | ICD-10-CM | POA: Diagnosis not present

## 2019-04-07 DIAGNOSIS — N183 Chronic kidney disease, stage 3 unspecified: Secondary | ICD-10-CM | POA: Diagnosis not present

## 2019-04-07 DIAGNOSIS — E1169 Type 2 diabetes mellitus with other specified complication: Secondary | ICD-10-CM | POA: Diagnosis not present

## 2019-05-04 DIAGNOSIS — R809 Proteinuria, unspecified: Secondary | ICD-10-CM | POA: Diagnosis not present

## 2019-05-04 DIAGNOSIS — I129 Hypertensive chronic kidney disease with stage 1 through stage 4 chronic kidney disease, or unspecified chronic kidney disease: Secondary | ICD-10-CM | POA: Diagnosis not present

## 2019-05-04 DIAGNOSIS — E785 Hyperlipidemia, unspecified: Secondary | ICD-10-CM | POA: Diagnosis not present

## 2019-05-04 DIAGNOSIS — N183 Chronic kidney disease, stage 3 unspecified: Secondary | ICD-10-CM | POA: Diagnosis not present

## 2019-05-04 DIAGNOSIS — Z720 Tobacco use: Secondary | ICD-10-CM | POA: Diagnosis not present

## 2019-05-04 DIAGNOSIS — E1122 Type 2 diabetes mellitus with diabetic chronic kidney disease: Secondary | ICD-10-CM | POA: Diagnosis not present

## 2019-05-07 DIAGNOSIS — E1169 Type 2 diabetes mellitus with other specified complication: Secondary | ICD-10-CM | POA: Diagnosis not present

## 2019-05-07 DIAGNOSIS — I129 Hypertensive chronic kidney disease with stage 1 through stage 4 chronic kidney disease, or unspecified chronic kidney disease: Secondary | ICD-10-CM | POA: Diagnosis not present

## 2019-05-07 DIAGNOSIS — E782 Mixed hyperlipidemia: Secondary | ICD-10-CM | POA: Diagnosis not present

## 2019-05-07 DIAGNOSIS — N183 Chronic kidney disease, stage 3 unspecified: Secondary | ICD-10-CM | POA: Diagnosis not present

## 2019-06-02 DIAGNOSIS — N183 Chronic kidney disease, stage 3 unspecified: Secondary | ICD-10-CM | POA: Diagnosis not present

## 2019-06-07 DIAGNOSIS — E782 Mixed hyperlipidemia: Secondary | ICD-10-CM | POA: Diagnosis not present

## 2019-06-07 DIAGNOSIS — E1169 Type 2 diabetes mellitus with other specified complication: Secondary | ICD-10-CM | POA: Diagnosis not present

## 2019-06-07 DIAGNOSIS — N183 Chronic kidney disease, stage 3 unspecified: Secondary | ICD-10-CM | POA: Diagnosis not present

## 2019-06-07 DIAGNOSIS — I129 Hypertensive chronic kidney disease with stage 1 through stage 4 chronic kidney disease, or unspecified chronic kidney disease: Secondary | ICD-10-CM | POA: Diagnosis not present

## 2019-07-07 DIAGNOSIS — E782 Mixed hyperlipidemia: Secondary | ICD-10-CM | POA: Diagnosis not present

## 2019-07-07 DIAGNOSIS — E1169 Type 2 diabetes mellitus with other specified complication: Secondary | ICD-10-CM | POA: Diagnosis not present

## 2019-07-07 DIAGNOSIS — I129 Hypertensive chronic kidney disease with stage 1 through stage 4 chronic kidney disease, or unspecified chronic kidney disease: Secondary | ICD-10-CM | POA: Diagnosis not present

## 2019-07-07 DIAGNOSIS — N183 Chronic kidney disease, stage 3 unspecified: Secondary | ICD-10-CM | POA: Diagnosis not present

## 2019-07-20 DIAGNOSIS — E1169 Type 2 diabetes mellitus with other specified complication: Secondary | ICD-10-CM | POA: Diagnosis not present

## 2019-07-27 DIAGNOSIS — E782 Mixed hyperlipidemia: Secondary | ICD-10-CM | POA: Diagnosis not present

## 2019-07-27 DIAGNOSIS — F339 Major depressive disorder, recurrent, unspecified: Secondary | ICD-10-CM | POA: Diagnosis not present

## 2019-07-27 DIAGNOSIS — E1169 Type 2 diabetes mellitus with other specified complication: Secondary | ICD-10-CM | POA: Diagnosis not present

## 2019-07-27 DIAGNOSIS — R197 Diarrhea, unspecified: Secondary | ICD-10-CM | POA: Diagnosis not present

## 2019-08-07 DIAGNOSIS — E1169 Type 2 diabetes mellitus with other specified complication: Secondary | ICD-10-CM | POA: Diagnosis not present

## 2019-08-07 DIAGNOSIS — E782 Mixed hyperlipidemia: Secondary | ICD-10-CM | POA: Diagnosis not present

## 2019-08-07 DIAGNOSIS — I129 Hypertensive chronic kidney disease with stage 1 through stage 4 chronic kidney disease, or unspecified chronic kidney disease: Secondary | ICD-10-CM | POA: Diagnosis not present

## 2019-08-07 DIAGNOSIS — N183 Chronic kidney disease, stage 3 unspecified: Secondary | ICD-10-CM | POA: Diagnosis not present

## 2019-10-08 DIAGNOSIS — N183 Chronic kidney disease, stage 3 unspecified: Secondary | ICD-10-CM | POA: Diagnosis not present

## 2019-10-08 DIAGNOSIS — I129 Hypertensive chronic kidney disease with stage 1 through stage 4 chronic kidney disease, or unspecified chronic kidney disease: Secondary | ICD-10-CM | POA: Diagnosis not present

## 2019-10-08 DIAGNOSIS — E782 Mixed hyperlipidemia: Secondary | ICD-10-CM | POA: Diagnosis not present

## 2019-10-08 DIAGNOSIS — E1169 Type 2 diabetes mellitus with other specified complication: Secondary | ICD-10-CM | POA: Diagnosis not present

## 2019-11-07 DIAGNOSIS — N183 Chronic kidney disease, stage 3 unspecified: Secondary | ICD-10-CM | POA: Diagnosis not present

## 2019-11-07 DIAGNOSIS — E1169 Type 2 diabetes mellitus with other specified complication: Secondary | ICD-10-CM | POA: Diagnosis not present

## 2019-11-07 DIAGNOSIS — I129 Hypertensive chronic kidney disease with stage 1 through stage 4 chronic kidney disease, or unspecified chronic kidney disease: Secondary | ICD-10-CM | POA: Diagnosis not present

## 2019-11-07 DIAGNOSIS — E782 Mixed hyperlipidemia: Secondary | ICD-10-CM | POA: Diagnosis not present

## 2019-11-08 DIAGNOSIS — N183 Chronic kidney disease, stage 3 unspecified: Secondary | ICD-10-CM | POA: Diagnosis not present

## 2019-11-15 DIAGNOSIS — Z720 Tobacco use: Secondary | ICD-10-CM | POA: Diagnosis not present

## 2019-11-15 DIAGNOSIS — I129 Hypertensive chronic kidney disease with stage 1 through stage 4 chronic kidney disease, or unspecified chronic kidney disease: Secondary | ICD-10-CM | POA: Diagnosis not present

## 2019-11-15 DIAGNOSIS — N183 Chronic kidney disease, stage 3 unspecified: Secondary | ICD-10-CM | POA: Diagnosis not present

## 2019-11-15 DIAGNOSIS — R809 Proteinuria, unspecified: Secondary | ICD-10-CM | POA: Diagnosis not present

## 2019-11-15 DIAGNOSIS — E785 Hyperlipidemia, unspecified: Secondary | ICD-10-CM | POA: Diagnosis not present

## 2019-11-15 DIAGNOSIS — E1122 Type 2 diabetes mellitus with diabetic chronic kidney disease: Secondary | ICD-10-CM | POA: Diagnosis not present

## 2019-11-30 DIAGNOSIS — E1169 Type 2 diabetes mellitus with other specified complication: Secondary | ICD-10-CM | POA: Diagnosis not present

## 2019-11-30 DIAGNOSIS — Z0000 Encounter for general adult medical examination without abnormal findings: Secondary | ICD-10-CM | POA: Diagnosis not present

## 2019-11-30 DIAGNOSIS — Z1331 Encounter for screening for depression: Secondary | ICD-10-CM | POA: Diagnosis not present

## 2019-11-30 DIAGNOSIS — Z1339 Encounter for screening examination for other mental health and behavioral disorders: Secondary | ICD-10-CM | POA: Diagnosis not present

## 2019-11-30 DIAGNOSIS — Z139 Encounter for screening, unspecified: Secondary | ICD-10-CM | POA: Diagnosis not present

## 2019-12-02 DIAGNOSIS — M5416 Radiculopathy, lumbar region: Secondary | ICD-10-CM | POA: Diagnosis not present

## 2019-12-02 DIAGNOSIS — M545 Low back pain: Secondary | ICD-10-CM | POA: Diagnosis not present

## 2019-12-04 DIAGNOSIS — M5410 Radiculopathy, site unspecified: Secondary | ICD-10-CM | POA: Diagnosis not present

## 2019-12-04 DIAGNOSIS — Z6826 Body mass index (BMI) 26.0-26.9, adult: Secondary | ICD-10-CM | POA: Diagnosis not present

## 2019-12-07 DIAGNOSIS — M5410 Radiculopathy, site unspecified: Secondary | ICD-10-CM | POA: Diagnosis not present

## 2019-12-07 DIAGNOSIS — E1169 Type 2 diabetes mellitus with other specified complication: Secondary | ICD-10-CM | POA: Diagnosis not present

## 2019-12-07 DIAGNOSIS — Z6826 Body mass index (BMI) 26.0-26.9, adult: Secondary | ICD-10-CM | POA: Diagnosis not present

## 2019-12-07 DIAGNOSIS — E782 Mixed hyperlipidemia: Secondary | ICD-10-CM | POA: Diagnosis not present

## 2019-12-08 DIAGNOSIS — E1169 Type 2 diabetes mellitus with other specified complication: Secondary | ICD-10-CM | POA: Diagnosis not present

## 2019-12-08 DIAGNOSIS — E782 Mixed hyperlipidemia: Secondary | ICD-10-CM | POA: Diagnosis not present

## 2019-12-08 DIAGNOSIS — I129 Hypertensive chronic kidney disease with stage 1 through stage 4 chronic kidney disease, or unspecified chronic kidney disease: Secondary | ICD-10-CM | POA: Diagnosis not present

## 2019-12-08 DIAGNOSIS — N183 Chronic kidney disease, stage 3 unspecified: Secondary | ICD-10-CM | POA: Diagnosis not present

## 2019-12-13 DIAGNOSIS — M5416 Radiculopathy, lumbar region: Secondary | ICD-10-CM | POA: Diagnosis not present

## 2019-12-14 DIAGNOSIS — F17210 Nicotine dependence, cigarettes, uncomplicated: Secondary | ICD-10-CM | POA: Diagnosis not present

## 2019-12-14 DIAGNOSIS — E1169 Type 2 diabetes mellitus with other specified complication: Secondary | ICD-10-CM | POA: Diagnosis not present

## 2019-12-14 DIAGNOSIS — E1140 Type 2 diabetes mellitus with diabetic neuropathy, unspecified: Secondary | ICD-10-CM | POA: Diagnosis not present

## 2019-12-14 DIAGNOSIS — J449 Chronic obstructive pulmonary disease, unspecified: Secondary | ICD-10-CM | POA: Diagnosis not present

## 2019-12-14 DIAGNOSIS — F339 Major depressive disorder, recurrent, unspecified: Secondary | ICD-10-CM | POA: Diagnosis not present

## 2019-12-14 DIAGNOSIS — F17200 Nicotine dependence, unspecified, uncomplicated: Secondary | ICD-10-CM | POA: Diagnosis not present

## 2019-12-15 DIAGNOSIS — R531 Weakness: Secondary | ICD-10-CM | POA: Diagnosis not present

## 2019-12-15 DIAGNOSIS — R262 Difficulty in walking, not elsewhere classified: Secondary | ICD-10-CM | POA: Diagnosis not present

## 2019-12-15 DIAGNOSIS — M545 Low back pain, unspecified: Secondary | ICD-10-CM | POA: Diagnosis not present

## 2019-12-15 DIAGNOSIS — M5416 Radiculopathy, lumbar region: Secondary | ICD-10-CM | POA: Diagnosis not present

## 2019-12-15 DIAGNOSIS — M2560 Stiffness of unspecified joint, not elsewhere classified: Secondary | ICD-10-CM | POA: Diagnosis not present

## 2019-12-18 DIAGNOSIS — R262 Difficulty in walking, not elsewhere classified: Secondary | ICD-10-CM | POA: Diagnosis not present

## 2019-12-18 DIAGNOSIS — M545 Low back pain, unspecified: Secondary | ICD-10-CM | POA: Diagnosis not present

## 2019-12-18 DIAGNOSIS — R531 Weakness: Secondary | ICD-10-CM | POA: Diagnosis not present

## 2019-12-18 DIAGNOSIS — M5416 Radiculopathy, lumbar region: Secondary | ICD-10-CM | POA: Diagnosis not present

## 2019-12-18 DIAGNOSIS — M2560 Stiffness of unspecified joint, not elsewhere classified: Secondary | ICD-10-CM | POA: Diagnosis not present

## 2019-12-22 DIAGNOSIS — R262 Difficulty in walking, not elsewhere classified: Secondary | ICD-10-CM | POA: Diagnosis not present

## 2019-12-22 DIAGNOSIS — M5416 Radiculopathy, lumbar region: Secondary | ICD-10-CM | POA: Diagnosis not present

## 2019-12-22 DIAGNOSIS — M2560 Stiffness of unspecified joint, not elsewhere classified: Secondary | ICD-10-CM | POA: Diagnosis not present

## 2019-12-22 DIAGNOSIS — R531 Weakness: Secondary | ICD-10-CM | POA: Diagnosis not present

## 2019-12-22 DIAGNOSIS — M545 Low back pain, unspecified: Secondary | ICD-10-CM | POA: Diagnosis not present

## 2019-12-27 DIAGNOSIS — M5410 Radiculopathy, site unspecified: Secondary | ICD-10-CM | POA: Diagnosis not present

## 2019-12-27 DIAGNOSIS — M545 Low back pain, unspecified: Secondary | ICD-10-CM | POA: Diagnosis not present

## 2020-01-01 DIAGNOSIS — M5126 Other intervertebral disc displacement, lumbar region: Secondary | ICD-10-CM | POA: Diagnosis not present

## 2020-01-01 DIAGNOSIS — M5417 Radiculopathy, lumbosacral region: Secondary | ICD-10-CM | POA: Diagnosis not present

## 2020-01-08 ENCOUNTER — Other Ambulatory Visit: Payer: Self-pay | Admitting: Neurological Surgery

## 2020-01-08 DIAGNOSIS — E1140 Type 2 diabetes mellitus with diabetic neuropathy, unspecified: Secondary | ICD-10-CM | POA: Diagnosis not present

## 2020-01-08 DIAGNOSIS — F339 Major depressive disorder, recurrent, unspecified: Secondary | ICD-10-CM | POA: Diagnosis not present

## 2020-01-08 DIAGNOSIS — E1169 Type 2 diabetes mellitus with other specified complication: Secondary | ICD-10-CM | POA: Diagnosis not present

## 2020-01-08 DIAGNOSIS — J449 Chronic obstructive pulmonary disease, unspecified: Secondary | ICD-10-CM | POA: Diagnosis not present

## 2020-01-08 DIAGNOSIS — M5417 Radiculopathy, lumbosacral region: Secondary | ICD-10-CM

## 2020-01-15 ENCOUNTER — Other Ambulatory Visit: Payer: Self-pay

## 2020-01-15 ENCOUNTER — Ambulatory Visit
Admission: RE | Admit: 2020-01-15 | Discharge: 2020-01-15 | Disposition: A | Discharge status: Home / Self Care | Payer: Medicare Other | Source: Ambulatory Visit | Attending: Neurological Surgery | Admitting: Neurological Surgery

## 2020-01-15 DIAGNOSIS — M5417 Radiculopathy, lumbosacral region: Secondary | ICD-10-CM

## 2020-01-15 DIAGNOSIS — M5116 Intervertebral disc disorders with radiculopathy, lumbar region: Secondary | ICD-10-CM | POA: Diagnosis not present

## 2020-01-15 MED ORDER — IOPAMIDOL (ISOVUE-M 200) INJECTION 41%
1.0000 mL | Freq: Once | INTRAMUSCULAR | Status: AC
  Administered 2020-01-15: 1 mL via EPIDURAL

## 2020-01-15 MED ORDER — METHYLPREDNISOLONE ACETATE 40 MG/ML INJ SUSP (RADIOLOG
120.0000 mg | Freq: Once | INTRAMUSCULAR | Status: AC
  Administered 2020-01-15: 120 mg via EPIDURAL

## 2020-01-15 NOTE — Discharge Instructions (Signed)

## 2020-01-29 DIAGNOSIS — R030 Elevated blood-pressure reading, without diagnosis of hypertension: Secondary | ICD-10-CM | POA: Diagnosis not present

## 2020-01-29 DIAGNOSIS — M5126 Other intervertebral disc displacement, lumbar region: Secondary | ICD-10-CM | POA: Diagnosis not present

## 2020-01-29 DIAGNOSIS — Z6827 Body mass index (BMI) 27.0-27.9, adult: Secondary | ICD-10-CM | POA: Diagnosis not present

## 2020-01-29 DIAGNOSIS — M5417 Radiculopathy, lumbosacral region: Secondary | ICD-10-CM | POA: Diagnosis not present

## 2020-01-31 ENCOUNTER — Other Ambulatory Visit: Payer: Self-pay | Admitting: Neurological Surgery

## 2020-02-07 DIAGNOSIS — F339 Major depressive disorder, recurrent, unspecified: Secondary | ICD-10-CM | POA: Diagnosis not present

## 2020-02-07 DIAGNOSIS — E1169 Type 2 diabetes mellitus with other specified complication: Secondary | ICD-10-CM | POA: Diagnosis not present

## 2020-02-07 DIAGNOSIS — J449 Chronic obstructive pulmonary disease, unspecified: Secondary | ICD-10-CM | POA: Diagnosis not present

## 2020-02-07 DIAGNOSIS — E1140 Type 2 diabetes mellitus with diabetic neuropathy, unspecified: Secondary | ICD-10-CM | POA: Diagnosis not present

## 2020-02-13 ENCOUNTER — Other Ambulatory Visit: Payer: Self-pay

## 2020-02-13 ENCOUNTER — Other Ambulatory Visit (HOSPITAL_COMMUNITY)
Admission: RE | Admit: 2020-02-13 | Discharge: 2020-02-13 | Disposition: A | Discharge status: Home / Self Care | Payer: Medicare Other | Source: Ambulatory Visit | Attending: Neurological Surgery | Admitting: Neurological Surgery

## 2020-02-13 ENCOUNTER — Encounter (HOSPITAL_COMMUNITY): Payer: Self-pay

## 2020-02-13 ENCOUNTER — Encounter (HOSPITAL_COMMUNITY)
Admission: RE | Admit: 2020-02-13 | Discharge: 2020-02-13 | Disposition: A | Discharge status: Home / Self Care | Payer: Medicare Other | Source: Ambulatory Visit | Attending: Neurological Surgery | Admitting: Neurological Surgery

## 2020-02-13 DIAGNOSIS — Z20822 Contact with and (suspected) exposure to covid-19: Secondary | ICD-10-CM | POA: Diagnosis not present

## 2020-02-13 DIAGNOSIS — Z01812 Encounter for preprocedural laboratory examination: Secondary | ICD-10-CM | POA: Insufficient documentation

## 2020-02-13 HISTORY — DX: Family history of other specified conditions: Z84.89

## 2020-02-13 HISTORY — DX: Nausea with vomiting, unspecified: R11.2

## 2020-02-13 HISTORY — DX: Nausea with vomiting, unspecified: Z98.890

## 2020-02-13 HISTORY — DX: Chronic kidney disease, unspecified: N18.9

## 2020-02-13 HISTORY — DX: Unspecified osteoarthritis, unspecified site: M19.90

## 2020-02-13 HISTORY — DX: Essential (primary) hypertension: I10

## 2020-02-13 HISTORY — DX: Type 2 diabetes mellitus without complications: E11.9

## 2020-02-13 HISTORY — DX: Depression, unspecified: F32.A

## 2020-02-13 HISTORY — DX: Other complications of anesthesia, initial encounter: T88.59XA

## 2020-02-13 HISTORY — DX: Hyperlipidemia, unspecified: E78.5

## 2020-02-13 HISTORY — DX: Chronic obstructive pulmonary disease, unspecified: J44.9

## 2020-02-13 LAB — SURGICAL PCR SCREEN
MRSA, PCR: NEGATIVE
Staphylococcus aureus: NEGATIVE

## 2020-02-13 LAB — CBC
HCT: 51.5 % — ABNORMAL HIGH (ref 36.0–46.0)
Hemoglobin: 16.6 g/dL — ABNORMAL HIGH (ref 12.0–15.0)
MCH: 32.5 pg (ref 26.0–34.0)
MCHC: 32.2 g/dL (ref 30.0–36.0)
MCV: 101 fL — ABNORMAL HIGH (ref 80.0–100.0)
Platelets: 170 10*3/uL (ref 150–400)
RBC: 5.1 MIL/uL (ref 3.87–5.11)
RDW: 12.6 % (ref 11.5–15.5)
WBC: 8.3 10*3/uL (ref 4.0–10.5)
nRBC: 0 % (ref 0.0–0.2)

## 2020-02-13 LAB — BASIC METABOLIC PANEL
Anion gap: 13 (ref 5–15)
BUN: 19 mg/dL (ref 8–23)
CO2: 18 mmol/L — ABNORMAL LOW (ref 22–32)
Calcium: 9.6 mg/dL (ref 8.9–10.3)
Chloride: 104 mmol/L (ref 98–111)
Creatinine, Ser: 1.1 mg/dL — ABNORMAL HIGH (ref 0.44–1.00)
GFR, Estimated: 53 mL/min — ABNORMAL LOW (ref >60)
Glucose, Bld: 120 mg/dL — ABNORMAL HIGH (ref 70–99)
Potassium: 4.7 mmol/L (ref 3.5–5.1)
Sodium: 135 mmol/L (ref 135–145)

## 2020-02-13 LAB — SARS CORONAVIRUS 2 (TAT 6-24 HRS): SARS Coronavirus 2: NEGATIVE

## 2020-02-13 LAB — GLUCOSE, CAPILLARY: Glucose-Capillary: 121 mg/dL — ABNORMAL HIGH (ref 70–99)

## 2020-02-13 LAB — HEMOGLOBIN A1C
Hgb A1c MFr Bld: 7.4 % — ABNORMAL HIGH (ref 4.8–5.6)
Mean Plasma Glucose: 165.68 mg/dL

## 2020-02-13 NOTE — Progress Notes (Signed)
PCP - Dr. Marco Collie in [place], [place] Cardiologist - Denies  Chest x-ray - Not indicated EKG - 02/13/20 Stress Test - Years ago no f/u needed ECHO - Denies Cardiac Cath - Denies  Sleep Study - Denies  DM - Type II CBG at PAT appt 121 Fasting Blood Sugar - 110-117 Checks Blood Sugar __4-5___ times a week  Aspirin Instructions:Stopped aspirin per Dr. Reatha Armour. Last dose 02/08/20  COVID TEST- 02/13/20  Anesthesia review: No  Patient denies shortness of breath, fever, cough and chest pain at PAT appointment   All instructions explained to the patient, with a verbal understanding of the material. Patient agrees to go over the instructions while at home for a better understanding. Patient also instructed to self quarantine after being tested for COVID-19. The opportunity to ask questions was provided.

## 2020-02-13 NOTE — Progress Notes (Signed)
[name], [name] [address] [place] [address] Phone: [phone] Fax: [phone]      Your procedure is scheduled on Friday December 10th.  Report to [facility] Main Entrance "A" at 5:30 A.M., and check in at the Admitting office.  Call this number if you have problems the morning of surgery:  [phone]  Call [phone] if you have any questions prior to your surgery date Monday-Friday 8am-4pm    Remember:  Do not eat or drink anything after midnight the night before your surgery    Take these medicines the morning of surgery with A SIP OF WATER   escitalopram (LEXAPRO) 10 MG tablet  gabapentin (NEURONTIN) 300 MG capsule  rosuvastatin (CRESTOR) 20 MG tablet  tiZANidine (ZANAFLEX) 2 MG tablet  IF NEEDED  acetaminophen (TYLENOL) 500 MG tablet    WHAT DO I DO ABOUT MY DIABETES MEDICATION?   Marland Kitchen Do not take your Jardiance the day before surgery or the morning of surgery.   HOW TO MANAGE YOUR DIABETES BEFORE AND AFTER SURGERY  Why is it important to control my blood sugar before and after surgery? . Improving blood sugar levels before and after surgery helps healing and can limit problems. . A way of improving blood sugar control is eating a healthy diet by: o  Eating less sugar and carbohydrates o  Increasing activity/exercise o  Talking with your doctor about reaching your blood sugar goals . High blood sugars (greater than 180 mg/dL) can raise your risk of infections and slow your recovery, so you will need to focus on controlling your diabetes during the weeks before surgery. . Make sure that the doctor who takes care of your diabetes knows about your planned surgery including the date and location.  How do I manage my blood sugar before surgery? . Check your blood sugar at least 4 times a day, starting 2 days before surgery, to make sure that the level is not too high or low. . Check your blood sugar the  morning of your surgery when you wake up and every 2 hours until you get to the Short Stay unit. o If your blood sugar is less than 70 mg/dL, you will need to treat for low blood sugar: - Do not take insulin. - Treat a low blood sugar (less than 70 mg/dL) with  cup of clear juice (cranberry or apple), 4 glucose tablets, OR glucose gel. - Recheck blood sugar in 15 minutes after treatment (to make sure it is greater than 70 mg/dL). If your blood sugar is not greater than 70 mg/dL on recheck, call [phone] for further instructions. . Report your blood sugar to the short stay nurse when you get to Short Stay.  . If you are admitted to the hospital after surgery: o Your blood sugar will be checked by the staff and you will probably be given insulin after surgery (instead of oral diabetes medicines) to make sure you have good blood sugar levels. o The goal for blood sugar control after surgery is 80-180 mg/dL.   As of today, STOP taking any Aspirin (unless otherwise instructed by your surgeon) Aleve, Naproxen, Ibuprofen, Motrin, Advil, Goody's, BC's, all herbal medications, fish oil, and all vitamins.                      Do not wear jewelry, make up, or nail polish  Do not wear lotions, powders, perfumes, or deodorant.            Do not shave 48 hours prior to surgery.              Do not bring valuables to the hospital.            [facility] is not responsible for any belongings or valuables.  Do NOT Smoke (Tobacco/Vaping) or drink Alcohol 24 hours prior to your procedure If you use a CPAP at night, you may bring all equipment for your overnight stay.   Contacts, glasses, dentures or bridgework may not be worn into surgery.      For patients admitted to the hospital, discharge time will be determined by your treatment team.   Patients discharged the day of surgery will not be allowed to drive home, and someone needs to stay with them for 24 hours.    Special instructions:    [facility]- Preparing For Surgery  Before surgery, you can play an important role. Because skin is not sterile, your skin needs to be as free of germs as possible. You can reduce the number of germs on your skin by washing with CHG (chlorahexidine gluconate) Soap before surgery.  CHG is an antiseptic cleaner which kills germs and bonds with the skin to continue killing germs even after washing.    Oral Hygiene is also important to reduce your risk of infection.  Remember - BRUSH YOUR TEETH THE MORNING OF SURGERY WITH YOUR REGULAR TOOTHPASTE  Please do not use if you have an allergy to CHG or antibacterial soaps. If your skin becomes reddened/irritated stop using the CHG.  Do not shave (including legs and underarms) for at least 48 hours prior to first CHG shower. It is OK to shave your face.  Please follow these instructions carefully.   1. Shower the NIGHT BEFORE SURGERY and the MORNING OF SURGERY with CHG Soap.   2. If you chose to wash your hair, wash your hair first as usual with your normal shampoo.  3. After you shampoo, rinse your hair and body thoroughly to remove the shampoo.  4. Use CHG as you would any other liquid soap. You can apply CHG directly to the skin and wash gently with a scrungie or a clean washcloth.   5. Apply the CHG Soap to your body ONLY FROM THE NECK DOWN.  Do not use on open wounds or open sores. Avoid contact with your eyes, ears, mouth and genitals (private parts). Wash Face and genitals (private parts)  with your normal soap.   6. Wash thoroughly, paying special attention to the area where your surgery will be performed.  7. Thoroughly rinse your body with warm water from the neck down.  8. DO NOT shower/wash with your normal soap after using and rinsing off the CHG Soap.  9. Pat yourself dry with a CLEAN TOWEL.  10. Wear CLEAN PAJAMAS to bed the night before surgery  11. Place CLEAN SHEETS on your bed the night of your first shower and DO NOT SLEEP  WITH PETS.   Day of Surgery: Wear Clean/Comfortable clothing the morning of surgery Do not apply any deodorants/lotions.   Remember to brush your teeth WITH YOUR REGULAR TOOTHPASTE.   Please read over the following fact sheets that you were given.

## 2020-02-15 NOTE — Anesthesia Preprocedure Evaluation (Addendum)
Anesthesia Evaluation  Patient identified by MRN, date of birth, ID band Patient awake    Reviewed: Allergy & Precautions, NPO status , Patient's Chart, lab work & pertinent test results  History of Anesthesia Complications (+) PONVNegative for: history of anesthetic complications  Airway Mallampati: II  TM Distance: >3 FB Neck ROM: Full    Dental  (+) Upper Dentures, Partial Lower   Pulmonary COPD, Current Smoker,    Pulmonary exam normal        Cardiovascular hypertension, Pt. on medications Normal cardiovascular exam     Neuro/Psych Depression negative neurological ROS     GI/Hepatic negative GI ROS, Neg liver ROS,   Endo/Other  diabetes, Oral Hypoglycemic Agents  Renal/GU Renal InsufficiencyRenal disease (Cr 1.10)  negative genitourinary   Musculoskeletal  (+) Arthritis , Osteoarthritis,    Abdominal   Peds  Hematology negative hematology ROS (+)   Anesthesia Other Findings  Current smoker/COPD, HTN, DM Cr 1.10  Reproductive/Obstetrics                            Anesthesia Physical Anesthesia Plan  ASA: II  Anesthesia Plan: General   Post-op Pain Management:    Induction: Intravenous  PONV Risk Score and Plan: 3 and Ondansetron, Dexamethasone, Treatment may vary due to age or medical condition and Midazolam  Airway Management Planned: Oral ETT  Additional Equipment: None  Intra-op Plan:   Post-operative Plan: Extubation in OR  Informed Consent: I have reviewed the patients History and Physical, chart, labs and discussed the procedure including the risks, benefits and alternatives for the proposed anesthesia with the patient or authorized representative who has indicated his/her understanding and acceptance.     Dental advisory given  Plan Discussed with:   Anesthesia Plan Comments:        Anesthesia Quick Evaluation

## 2020-02-16 ENCOUNTER — Encounter (HOSPITAL_COMMUNITY): Payer: Self-pay | Admitting: Neurological Surgery

## 2020-02-16 ENCOUNTER — Ambulatory Visit (HOSPITAL_COMMUNITY): Payer: Medicare Other | Admitting: Anesthesiology

## 2020-02-16 ENCOUNTER — Encounter (HOSPITAL_COMMUNITY)
Admission: RE | Disposition: A | Discharge status: Home / Self Care | Payer: Self-pay | Source: Home / Self Care | Attending: Neurological Surgery

## 2020-02-16 ENCOUNTER — Other Ambulatory Visit (HOSPITAL_COMMUNITY): Payer: Self-pay | Admitting: Neurological Surgery

## 2020-02-16 ENCOUNTER — Other Ambulatory Visit: Payer: Self-pay

## 2020-02-16 ENCOUNTER — Ambulatory Visit (HOSPITAL_COMMUNITY): Payer: Medicare Other

## 2020-02-16 ENCOUNTER — Ambulatory Visit (HOSPITAL_COMMUNITY)
Admission: RE | Admit: 2020-02-16 | Discharge: 2020-02-16 | Disposition: A | Discharge status: Home / Self Care | Payer: Medicare Other | Attending: Neurological Surgery | Admitting: Neurological Surgery

## 2020-02-16 DIAGNOSIS — F17210 Nicotine dependence, cigarettes, uncomplicated: Secondary | ICD-10-CM | POA: Insufficient documentation

## 2020-02-16 DIAGNOSIS — Z7982 Long term (current) use of aspirin: Secondary | ICD-10-CM | POA: Diagnosis not present

## 2020-02-16 DIAGNOSIS — Z880 Allergy status to penicillin: Secondary | ICD-10-CM | POA: Diagnosis not present

## 2020-02-16 DIAGNOSIS — Z79899 Other long term (current) drug therapy: Secondary | ICD-10-CM | POA: Diagnosis not present

## 2020-02-16 DIAGNOSIS — E1122 Type 2 diabetes mellitus with diabetic chronic kidney disease: Secondary | ICD-10-CM | POA: Diagnosis not present

## 2020-02-16 DIAGNOSIS — M5116 Intervertebral disc disorders with radiculopathy, lumbar region: Secondary | ICD-10-CM | POA: Insufficient documentation

## 2020-02-16 DIAGNOSIS — N189 Chronic kidney disease, unspecified: Secondary | ICD-10-CM | POA: Diagnosis not present

## 2020-02-16 DIAGNOSIS — Z7984 Long term (current) use of oral hypoglycemic drugs: Secondary | ICD-10-CM | POA: Diagnosis not present

## 2020-02-16 DIAGNOSIS — I129 Hypertensive chronic kidney disease with stage 1 through stage 4 chronic kidney disease, or unspecified chronic kidney disease: Secondary | ICD-10-CM | POA: Diagnosis not present

## 2020-02-16 DIAGNOSIS — Z419 Encounter for procedure for purposes other than remedying health state, unspecified: Secondary | ICD-10-CM

## 2020-02-16 DIAGNOSIS — Z981 Arthrodesis status: Secondary | ICD-10-CM | POA: Diagnosis not present

## 2020-02-16 LAB — GLUCOSE, CAPILLARY
Glucose-Capillary: 128 mg/dL — ABNORMAL HIGH (ref 70–99)
Glucose-Capillary: 187 mg/dL — ABNORMAL HIGH (ref 70–99)

## 2020-02-16 SURGERY — LUMBAR LAMINECTOMY/ DECOMPRESSION WITH MET-RX
Anesthesia: General | Site: Spine Lumbar | Laterality: Right

## 2020-02-16 MED ORDER — SCOPOLAMINE 1 MG/3DAYS TD PT72
MEDICATED_PATCH | TRANSDERMAL | Status: DC | PRN
  Administered 2020-02-16: 1 via TRANSDERMAL

## 2020-02-16 MED ORDER — ROCURONIUM BROMIDE 10 MG/ML (PF) SYRINGE
PREFILLED_SYRINGE | INTRAVENOUS | Status: AC
  Filled 2020-02-16: qty 10

## 2020-02-16 MED ORDER — PHENYLEPHRINE HCL-NACL 10-0.9 MG/250ML-% IV SOLN
INTRAVENOUS | Status: DC | PRN
  Administered 2020-02-16: 40 ug/min via INTRAVENOUS

## 2020-02-16 MED ORDER — EPHEDRINE 5 MG/ML INJ
INTRAVENOUS | Status: AC
  Filled 2020-02-16: qty 10

## 2020-02-16 MED ORDER — BUPIVACAINE HCL (PF) 0.5 % IJ SOLN
INTRAMUSCULAR | Status: AC
  Filled 2020-02-16: qty 30

## 2020-02-16 MED ORDER — MIDAZOLAM HCL 2 MG/2ML IJ SOLN
INTRAMUSCULAR | Status: AC
  Filled 2020-02-16: qty 2

## 2020-02-16 MED ORDER — GABAPENTIN 300 MG PO CAPS: 300.0000 mg | ORAL_CAPSULE | Freq: Three times a day (TID) | ORAL | 1 refills | Status: DC

## 2020-02-16 MED ORDER — DEXAMETHASONE SODIUM PHOSPHATE 10 MG/ML IJ SOLN
INTRAMUSCULAR | Status: AC
  Filled 2020-02-16: qty 1

## 2020-02-16 MED ORDER — VANCOMYCIN HCL IN DEXTROSE 1-5 GM/200ML-% IV SOLN
INTRAVENOUS | Status: AC
  Administered 2020-02-16: 1000 mg via INTRAVENOUS
  Filled 2020-02-16: qty 200

## 2020-02-16 MED ORDER — HYDROCODONE-ACETAMINOPHEN 5-325 MG PO TABS: 2.0000 | ORAL_TABLET | Freq: Four times a day (QID) | ORAL | 0 refills | Status: DC | PRN

## 2020-02-16 MED ORDER — THROMBIN 5000 UNITS EX SOLR
OROMUCOSAL | Status: DC | PRN
  Administered 2020-02-16: 5 mL via TOPICAL

## 2020-02-16 MED ORDER — AMISULPRIDE (ANTIEMETIC) 5 MG/2ML IV SOLN: 10.0000 mg | Freq: Once | INTRAVENOUS | Status: DC | PRN

## 2020-02-16 MED ORDER — 0.9 % SODIUM CHLORIDE (POUR BTL) OPTIME
TOPICAL | Status: DC | PRN
  Administered 2020-02-16: 1000 mL

## 2020-02-16 MED ORDER — SCOPOLAMINE 1 MG/3DAYS TD PT72
MEDICATED_PATCH | TRANSDERMAL | Status: AC
  Filled 2020-02-16: qty 1

## 2020-02-16 MED ORDER — ONDANSETRON HCL 4 MG/2ML IJ SOLN
INTRAMUSCULAR | Status: AC
  Filled 2020-02-16: qty 2

## 2020-02-16 MED ORDER — LIDOCAINE HCL (PF) 2 % IJ SOLN
INTRAMUSCULAR | Status: AC
  Filled 2020-02-16: qty 5

## 2020-02-16 MED ORDER — LIDOCAINE-EPINEPHRINE 1 %-1:100000 IJ SOLN
INTRAMUSCULAR | Status: DC | PRN
  Administered 2020-02-16: 10 mL

## 2020-02-16 MED ORDER — PROPOFOL 10 MG/ML IV BOLUS
INTRAVENOUS | Status: DC | PRN
  Administered 2020-02-16: 120 mg via INTRAVENOUS

## 2020-02-16 MED ORDER — ORAL CARE MOUTH RINSE: 15.0000 mL | Freq: Once | OROMUCOSAL | Status: AC

## 2020-02-16 MED ORDER — VANCOMYCIN HCL IN DEXTROSE 1-5 GM/200ML-% IV SOLN: 1000.0000 mg | INTRAVENOUS | Status: AC

## 2020-02-16 MED ORDER — CHLORHEXIDINE GLUCONATE CLOTH 2 % EX PADS: 6.0000 | MEDICATED_PAD | Freq: Once | CUTANEOUS | Status: DC

## 2020-02-16 MED ORDER — OXYCODONE HCL 5 MG/5ML PO SOLN: 5.0000 mg | Freq: Once | ORAL | Status: DC | PRN

## 2020-02-16 MED ORDER — ALBUTEROL SULFATE HFA 108 (90 BASE) MCG/ACT IN AERS
INHALATION_SPRAY | RESPIRATORY_TRACT | Status: DC | PRN
  Administered 2020-02-16: 3 via RESPIRATORY_TRACT

## 2020-02-16 MED ORDER — FENTANYL CITRATE (PF) 100 MCG/2ML IJ SOLN
25.0000 ug | INTRAMUSCULAR | Status: DC | PRN
Start: 2020-02-16 — End: 2020-02-16

## 2020-02-16 MED ORDER — ROCURONIUM BROMIDE 10 MG/ML (PF) SYRINGE
PREFILLED_SYRINGE | INTRAVENOUS | Status: AC
  Filled 2020-02-16: qty 50

## 2020-02-16 MED ORDER — PHENYLEPHRINE 40 MCG/ML (10ML) SYRINGE FOR IV PUSH (FOR BLOOD PRESSURE SUPPORT)
PREFILLED_SYRINGE | INTRAVENOUS | Status: AC
  Filled 2020-02-16: qty 10

## 2020-02-16 MED ORDER — OXYCODONE HCL 5 MG PO TABS: 5.0000 mg | ORAL_TABLET | Freq: Once | ORAL | Status: DC | PRN

## 2020-02-16 MED ORDER — PROPOFOL 10 MG/ML IV BOLUS
INTRAVENOUS | Status: AC
  Filled 2020-02-16: qty 20

## 2020-02-16 MED ORDER — THROMBIN 5000 UNITS EX SOLR
CUTANEOUS | Status: AC
  Filled 2020-02-16: qty 5000

## 2020-02-16 MED ORDER — FENTANYL CITRATE (PF) 250 MCG/5ML IJ SOLN
INTRAMUSCULAR | Status: DC | PRN
  Administered 2020-02-16: 50 ug via INTRAVENOUS
  Administered 2020-02-16: 100 ug via INTRAVENOUS
  Administered 2020-02-16: 50 ug via INTRAVENOUS

## 2020-02-16 MED ORDER — DEXAMETHASONE SODIUM PHOSPHATE 10 MG/ML IJ SOLN
INTRAMUSCULAR | Status: DC | PRN
  Administered 2020-02-16: 5 mg via INTRAVENOUS

## 2020-02-16 MED ORDER — ONDANSETRON HCL 4 MG/2ML IJ SOLN
INTRAMUSCULAR | Status: DC | PRN
  Administered 2020-02-16: 4 mg via INTRAVENOUS

## 2020-02-16 MED ORDER — LACTATED RINGERS IV SOLN: INTRAVENOUS | Status: DC

## 2020-02-16 MED ORDER — CHLORHEXIDINE GLUCONATE 0.12 % MT SOLN
OROMUCOSAL | Status: AC
  Administered 2020-02-16: 15 mL via OROMUCOSAL
  Filled 2020-02-16: qty 15

## 2020-02-16 MED ORDER — LIDOCAINE 2% (20 MG/ML) 5 ML SYRINGE
INTRAMUSCULAR | Status: DC | PRN
  Administered 2020-02-16: 60 mg via INTRAVENOUS

## 2020-02-16 MED ORDER — LIDOCAINE-EPINEPHRINE 1 %-1:100000 IJ SOLN
INTRAMUSCULAR | Status: AC
  Filled 2020-02-16: qty 1

## 2020-02-16 MED ORDER — ACETAMINOPHEN 10 MG/ML IV SOLN
INTRAVENOUS | Status: AC
  Filled 2020-02-16: qty 100

## 2020-02-16 MED ORDER — FENTANYL CITRATE (PF) 250 MCG/5ML IJ SOLN
INTRAMUSCULAR | Status: AC
  Filled 2020-02-16: qty 5

## 2020-02-16 MED ORDER — EPHEDRINE SULFATE 50 MG/ML IJ SOLN
INTRAMUSCULAR | Status: DC | PRN
  Administered 2020-02-16: 10 mg via INTRAVENOUS
  Administered 2020-02-16 (×3): 5 mg via INTRAVENOUS

## 2020-02-16 MED ORDER — ROCURONIUM BROMIDE 10 MG/ML (PF) SYRINGE
PREFILLED_SYRINGE | INTRAVENOUS | Status: DC | PRN
  Administered 2020-02-16: 30 mg via INTRAVENOUS

## 2020-02-16 MED ORDER — CHLORHEXIDINE GLUCONATE 0.12 % MT SOLN: 15.0000 mL | Freq: Once | OROMUCOSAL | Status: AC

## 2020-02-16 MED ORDER — HEMOSTATIC AGENTS (NO CHARGE) OPTIME
TOPICAL | Status: DC | PRN
Start: 2020-02-16 — End: 2020-02-16
  Administered 2020-02-16: 1 via TOPICAL

## 2020-02-16 MED ORDER — TIZANIDINE HCL 2 MG PO TABS: 2.0000 mg | ORAL_TABLET | Freq: Three times a day (TID) | ORAL | 1 refills | Status: DC | PRN

## 2020-02-16 MED ORDER — ACETAMINOPHEN 10 MG/ML IV SOLN
INTRAVENOUS | Status: DC | PRN
  Administered 2020-02-16: 1000 mg via INTRAVENOUS

## 2020-02-16 MED ORDER — BUPIVACAINE-EPINEPHRINE (PF) 0.5% -1:200000 IJ SOLN
INTRAMUSCULAR | Status: DC | PRN
  Administered 2020-02-16: 10 mL

## 2020-02-16 MED ORDER — SUGAMMADEX SODIUM 200 MG/2ML IV SOLN
INTRAVENOUS | Status: DC | PRN
  Administered 2020-02-16: 100 mg via INTRAVENOUS

## 2020-02-16 MED ORDER — MIDAZOLAM HCL 5 MG/5ML IJ SOLN
INTRAMUSCULAR | Status: DC | PRN
  Administered 2020-02-16: 2 mg via INTRAVENOUS

## 2020-02-16 MED ORDER — ONDANSETRON HCL 4 MG/2ML IJ SOLN: 4.0000 mg | Freq: Once | INTRAMUSCULAR | Status: DC | PRN

## 2020-02-16 MED FILL — tiZANidine HCL 2 MG TABS: 2 | 30 days supply | Qty: 90 | Fill #0

## 2020-02-16 MED FILL — HYDROCODON-APAP 5-325: 5-325 | 4 days supply | Qty: 30 | Fill #0

## 2020-02-16 MED FILL — GABAPENTIN 300 MG CAPSULE: 300 | 30 days supply | Qty: 90 | Fill #0

## 2020-02-16 SURGICAL SUPPLY — 63 items
BAND RUBBER #18 3X1/16 STRL (MISCELLANEOUS) ×4 IMPLANT
BLADE SURG 11 STRL SS (BLADE) ×2 IMPLANT
BUR MATCHSTICK NEURO 3.0 LAGG (BURR) ×2 IMPLANT
CARTRIDGE OIL MAESTRO DRILL (MISCELLANEOUS) ×1 IMPLANT
CNTNR URN SCR LID CUP LEK RST (MISCELLANEOUS) ×1 IMPLANT
CONT SPEC 4OZ STRL OR WHT (MISCELLANEOUS) ×1
COVER BACK TABLE 60X90IN (DRAPES) ×2 IMPLANT
COVER MAYO STAND STRL (DRAPES) ×2 IMPLANT
COVER WAND RF STERILE (DRAPES) IMPLANT
DECANTER SPIKE VIAL GLASS SM (MISCELLANEOUS) ×4 IMPLANT
DERMABOND ADVANCED (GAUZE/BANDAGES/DRESSINGS) ×1
DERMABOND ADVANCED .7 DNX12 (GAUZE/BANDAGES/DRESSINGS) ×1 IMPLANT
DIFFUSER DRILL AIR PNEUMATIC (MISCELLANEOUS) ×2 IMPLANT
DRAPE C-ARM 42X72 X-RAY (DRAPES) ×2 IMPLANT
DRAPE LAPAROTOMY 100X72X124 (DRAPES) ×2 IMPLANT
DRAPE MICROSCOPE LEICA (MISCELLANEOUS) ×2 IMPLANT
DRAPE SURG 17X23 STRL (DRAPES) ×2 IMPLANT
DRSG OPSITE POSTOP 3X4 (GAUZE/BANDAGES/DRESSINGS) ×2 IMPLANT
DURAPREP 26ML APPLICATOR (WOUND CARE) ×2 IMPLANT
ELECT BLADE INSULATED 6.5IN (ELECTROSURGICAL) ×2
ELECT REM PT RETURN 9FT ADLT (ELECTROSURGICAL) ×2
ELECTRODE BLDE INSULATED 6.5IN (ELECTROSURGICAL) ×1 IMPLANT
ELECTRODE REM PT RTRN 9FT ADLT (ELECTROSURGICAL) ×1 IMPLANT
GAUZE 4X4 16PLY RFD (DISPOSABLE) IMPLANT
GAUZE SPONGE 4X4 12PLY STRL (GAUZE/BANDAGES/DRESSINGS) IMPLANT
GLOVE BIO SURGEON STRL SZ7.5 (GLOVE) ×2 IMPLANT
GLOVE BIOGEL PI IND STRL 7.5 (GLOVE) ×1 IMPLANT
GLOVE BIOGEL PI IND STRL 8 (GLOVE) ×1 IMPLANT
GLOVE BIOGEL PI INDICATOR 7.5 (GLOVE) ×1
GLOVE BIOGEL PI INDICATOR 8 (GLOVE) ×1
GLOVE ECLIPSE 8.0 STRL XLNG CF (GLOVE) IMPLANT
GLOVE SURG SS PI 6.0 STRL IVOR (GLOVE) ×6 IMPLANT
GLOVE SURG UNDER POLY LF SZ6.5 (GLOVE) ×2 IMPLANT
GOWN STRL REUS W/ TWL LRG LVL3 (GOWN DISPOSABLE) ×2 IMPLANT
GOWN STRL REUS W/ TWL XL LVL3 (GOWN DISPOSABLE) ×1 IMPLANT
GOWN STRL REUS W/TWL 2XL LVL3 (GOWN DISPOSABLE) IMPLANT
GOWN STRL REUS W/TWL LRG LVL3 (GOWN DISPOSABLE) ×2
GOWN STRL REUS W/TWL XL LVL3 (GOWN DISPOSABLE) ×1
HEMOSTAT POWDER KIT SURGIFOAM (HEMOSTASIS) ×2 IMPLANT
KIT BASIN OR (CUSTOM PROCEDURE TRAY) ×2 IMPLANT
KIT POSITION SURG JACKSON T1 (MISCELLANEOUS) ×2 IMPLANT
KIT TURNOVER KIT B (KITS) ×2 IMPLANT
MARKER SKIN DUAL TIP RULER LAB (MISCELLANEOUS) ×2 IMPLANT
NEEDLE HYPO 25X1 1.5 SAFETY (NEEDLE) ×2 IMPLANT
NEEDLE SPNL 18GX3.5 QUINCKE PK (NEEDLE) ×2 IMPLANT
NS IRRIG 1000ML POUR BTL (IV SOLUTION) ×2 IMPLANT
OIL CARTRIDGE MAESTRO DRILL (MISCELLANEOUS) ×2
PACK LAMINECTOMY NEURO (CUSTOM PROCEDURE TRAY) ×2 IMPLANT
PAD ARMBOARD 7.5X6 YLW CONV (MISCELLANEOUS) ×10 IMPLANT
PATTIES SURGICAL .5 X.5 (GAUZE/BANDAGES/DRESSINGS) IMPLANT
PATTIES SURGICAL .5 X1 (DISPOSABLE) IMPLANT
PATTIES SURGICAL 1X1 (DISPOSABLE) IMPLANT
SEALANT ADHERUS EXTEND TIP (MISCELLANEOUS) ×2 IMPLANT
SPONGE LAP 4X18 RFD (DISPOSABLE) IMPLANT
STAPLER VISISTAT 35W (STAPLE) IMPLANT
SUT VIC AB 0 CT1 27 (SUTURE) ×1
SUT VIC AB 0 CT1 27XBRD ANBCTR (SUTURE) ×1 IMPLANT
SUT VIC AB 2-0 CP2 18 (SUTURE) ×2 IMPLANT
SUT VIC AB 3-0 SH 8-18 (SUTURE) IMPLANT
TOWEL GREEN STERILE (TOWEL DISPOSABLE) ×2 IMPLANT
TOWEL GREEN STERILE FF (TOWEL DISPOSABLE) ×2 IMPLANT
TRAY FOLEY MTR SLVR 16FR STAT (SET/KITS/TRAYS/PACK) IMPLANT
WATER STERILE IRR 1000ML POUR (IV SOLUTION) ×2 IMPLANT

## 2020-02-16 NOTE — Anesthesia Postprocedure Evaluation (Signed)
Anesthesia Post Note  Patient: [name] [name]  Procedure(s) Performed: MIS MICRODISCECTOMY RIGHT LUMBAR THREE-FOUR (Right Spine Lumbar)     Patient location during evaluation: PACU Anesthesia Type: General Level of consciousness: awake and alert Pain management: pain level controlled Vital Signs Assessment: post-procedure vital signs reviewed and stable Respiratory status: spontaneous breathing, nonlabored ventilation and respiratory function stable Cardiovascular status: blood pressure returned to baseline and stable Postop Assessment: no apparent nausea or vomiting Anesthetic complications: no   No complications documented.  Last Vitals:  Vitals:   02/16/20 1108 02/16/20 1123  BP: (!) 114/38   Pulse: 92   Resp: 15   Temp:  (!) 36.4 C  SpO2: 93%     Last Pain:  Vitals:   02/16/20 1123  TempSrc:   PainSc: 0-No pain                 Lidia Collum

## 2020-02-16 NOTE — Anesthesia Procedure Notes (Signed)
Procedure Name: Intubation Date/Time: 02/16/2020 7:56 AM Performed by: Mariea Clonts, CRNA Pre-anesthesia Checklist: Patient identified, Emergency Drugs available, Suction available and Patient being monitored Patient Re-evaluated:Patient Re-evaluated prior to induction Oxygen Delivery Method: Circle System Utilized Preoxygenation: Pre-oxygenation with 100% oxygen Induction Type: IV induction Ventilation: Mask ventilation without difficulty and Oral airway inserted - appropriate to patient size Laryngoscope Size: Sabra Heck and 2 Grade View: Grade I Tube type: Oral Tube size: 7.0 mm Number of attempts: 1 Airway Equipment and Method: Stylet and Oral airway Placement Confirmation: ETT inserted through vocal cords under direct vision,  positive ETCO2 and breath sounds checked- equal and bilateral Tube secured with: Tape Dental Injury: Teeth and Oropharynx as per pre-operative assessment

## 2020-02-16 NOTE — H&P (Signed)
Providing Compassionate, Quality Care - Together  NEUROSURGERY HISTORY & PHYSICAL   [name] [name] is an 74 y.o. female.   Chief Complaint: Right lower extremity radiculopathy HPI: This is a pleasant 74 year old female that has an L3-4 right large herniated nucleus pulposus with caudal migration and compression of the L4 nerve root.  She has had persistent right lower extremity radiculopathy and weakness despite conservative measures which include an epidural steroid injection and medication management.  She presents today for an L3-4 microdiscectomy.  Her symptoms have returned completely, right L4 radiculopathy radiating down to her anterior lateral shin.  She complains of some difficulty walking due to her pain.  She denies any bowel or bladder changes.  Past Medical History:  Diagnosis Date  . Anemia 2016  . Chronic kidney disease   . Complication of anesthesia    Hard to wake up  . COPD (chronic obstructive pulmonary disease) ([place])   . Depression   . Diabetes mellitus without complication ([place])   . Family history of adverse reaction to anesthesia    Father had post op N & V  . Hyperlipidemia   . Hypertension   . Osteoarthritis   . PONV (postoperative nausea and vomiting)     Past Surgical History:  Procedure Laterality Date  . ADENOIDECTOMY    . APPENDECTOMY    . CHOLECYSTECTOMY    . EYE SURGERY Bilateral    implants for cataracts  . neuroplasty decompression     median nerve at carpal tunnel  . TONSILLECTOMY    . TUBAL LIGATION      History reviewed. No pertinent family history. Social History:  reports that she has been smoking cigarettes. She has a 26.00 pack-year smoking history. She has never used smokeless tobacco. She reports current alcohol use. She reports that she does not use drugs.  Allergies:  Allergies  Allergen Reactions  . Amoxicillin-Pot Clavulanate Rash    Medications Prior to Admission  Medication Sig Dispense Refill  . acetaminophen  (TYLENOL) 500 MG tablet Take 1,000 mg by mouth in the morning, at noon, and at bedtime.    Marland Kitchen aspirin EC 81 MG tablet Take 81 mg by mouth daily. Swallow whole.    . cholecalciferol (VITAMIN D3) 25 MCG (1000 UNIT) tablet Take 1,000 Units by mouth daily.    . empagliflozin (JARDIANCE) 25 MG TABS tablet Take 25 mg by mouth daily.    Marland Kitchen escitalopram (LEXAPRO) 10 MG tablet Take 10 mg by mouth daily.    Marland Kitchen FeFum-FePoly-FA-B Cmp-C-Biot (INTEGRA PLUS) CAPS Take 1 capsule by mouth daily.    Marland Kitchen gabapentin (NEURONTIN) 300 MG capsule Take 300 mg by mouth 3 (three) times daily.    Marland Kitchen omega-3 acid ethyl esters (LOVAZA) 1 g capsule Take 2 g by mouth 2 (two) times daily.    . rosuvastatin (CRESTOR) 20 MG tablet Take 20 mg by mouth daily.    Marland Kitchen telmisartan (MICARDIS) 40 MG tablet Take 40 mg by mouth daily.    Marland Kitchen tiZANidine (ZANAFLEX) 2 MG tablet Take 2 mg by mouth 3 (three) times daily.    . vitamin B-12 (CYANOCOBALAMIN) 1000 MCG tablet Take 1,000 mcg by mouth daily.      Results for orders placed or performed during the hospital encounter of 02/16/20 (from the past 48 hour(s))  Glucose, capillary     Status: Abnormal   Collection Time: 02/16/20  6:31 AM  Result Value Ref Range   Glucose-Capillary 128 (H) 70 - 99 mg/dL  Comment: Glucose reference range applies only to samples taken after fasting for at least 8 hours.   No results found.  ROS 14 point review was performed, all positives and negatives listed in HPI above  Blood pressure (!) 136/48, pulse 63, temperature 98.1 F (36.7 C), temperature source Oral, height 5\' 1"  (1.549 m), weight 63.7 kg, SpO2 95 %. Physical Exam  A&O x3 PERRLA Cranial nerves II through XII intact Bilateral upper extremity 5/5 Right lower extremity 4/5 proximally, distal 5/5 Left lower extremity 5/5 Sensory intact light touch, except for right L4 dermatome Positive straight leg raise on the right  Assessment/Plan 74 year old female with  1.  L3-4 herniated nucleus  pulposus, with right L4 radiculopathy  -OR today for L3-4 right microdiscectomy -All risks, benefits, alternatives discussed and agreed upon with the patient.  She would like to proceed with surgical intervention.   Thank you for allowing me to participate in this patient's care.  Please do not hesitate to call with questions or concerns.   Elwin Sleight, [facility] Cell: [phone]

## 2020-02-16 NOTE — Op Note (Signed)
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  L3-4 right hemi-laminectomy, medial facetectomy and microdiscectomy for decompression of right L4 nerve root 2. Use of operating microscope 3. Use of intraoperative fluoroscopy  SURGEON: Dr. Pieter Partridge C. Azarya Oconnell, DO  ASSISTANT: Dr. Emelda Brothers, MD  ANESTHESIA: General Endotracheal  EBL: 50 cc  SPECIMENS: None  DRAINS: None  COMPLICATIONS: None  CONDITION: Hemodynamically stable  HISTORY: [name] [name] is a 74 y.o. female presented to my office with right lower extremity radiculopathy and MRI revealed a large L3-4 disc herniation with caudal migration compressing the L4 nerve root.  We attempted conservative management which consisted of medication for pain control as well as epidural steroid injection.  Her pain returned significantly after epidural steroid injection therefore she elected to undergo surgical intervention.  All risks, benefits and alternatives were discussed and agreed upon.  PROCEDURE IN DETAIL: After informed consent was obtained and witnessed, the patient was brought to the operating room at [facility]. After induction of general anesthesia, the patient was positioned on the operative table in the prone position with all pressure points meticulously padded. The skin of the low back was then prepped and draped in the usual sterile fashion.   Under fluoroscopy, the correct level (L3-4) was identified and marked out on the skin, and after timeout was conducted, the skin was infiltrated with local anesthetic. Skin incision was then made sharply with a 10 blade in the midline.  Hemostasis was achieved with bipolar cautery.  Using the Metrix dilators, the right L3-4 lamina was dilated and a 20 mm tube, tapered was placed over the right L3-4 disc space.  Under fluoroscopy, the corrected space was confirmed.  The microscope  was sterilely draped and brought into the field.  Using Bovie electrocautery, soft tissue was cleared from the right L3 lamina and L3-4 medial facet.   Using a high-speed drill, laminotomy was completed with a partial medial facetectomy.  A superior portion of the L4 lamina was drilled with a high-speed drill.  The ligamentum flavum was then identified and removed and the lateral edge of the thecal sac was identified. This was then traced down to identify the traversing nerve root, right L4.  This was performed with a series of micro curettes.  A small durotomy was noted.  Dissection was then carried out superior and lateral to the nerve root to identify the disc herniation.  The right L4 nerve root was clearly tented over the disc herniation.  The posterior annulus was identified, coagulated with bipolar cautery and then incised with an 11 blade and using a combination of micro dissectors, micro curettes, and pituitary rongeurs, the herniated disc fragments were removed that were caudally migrated. The decompression of the nerve root was confirmed using a blunt nerve hook.   This was confirmed down to the level of the L4 pedicle. Using a blunt nerve hook again the L3 and L4 nerves were noted to be completely decompressed.  The thecal sac was pulsatile.  The small durotomy was repaired with a small piece of Gelfoam and dural sealant.  This was observed for period of minutes in which there was no further egress of CSF.   Hemostasis was then secured using a combination of morcellized Gelfoam and thrombin and bipolar electrocautery. The wound is irrigated with copious amounts of antibiotic saline irrigation. The wound was excellently hemostatic and there was no egress of CSF. The  Metrix tube was removed, hemostasis was achieved with bipolar cautery.  The wound was closed in layers with 2-0 Vicryl sutures. The skin was closed using standard skin glue.   At the end of the case all sponge, needle, and instrument  counts were correct. The patient was then transferred to the stretcher and taken to the postanesthesia care unit in stable hemodynamic condition.tretcher and taken to the postanesthesia care unit in stable hemodynamic condition.

## 2020-02-16 NOTE — Discharge Instructions (Addendum)
OK TO SHOWER IN 3 DAYS OK TO REMOVE BANDAGE IN 3 DAYS AFTER SHOWER NO BANDAGE NEEDED AFTER 3 DAYS RESTART ASPIRIN IN 7 DAYS AFTER SURGERY NO BENDING/LIFTING/TWISTING MORE THAN 10 LBS UNTIL SEEN IN THE OFFICE POSTOP

## 2020-02-16 NOTE — Transfer of Care (Signed)
Immediate Anesthesia Transfer of Care Note  Patient: [name] [name]  Procedure(s) Performed: MIS MICRODISCECTOMY RIGHT LUMBAR THREE-FOUR (Right Spine Lumbar)  Patient Location: PACU  Anesthesia Type:General  Level of Consciousness: awake, alert  and oriented  Airway & Oxygen Therapy: Patient Spontanous Breathing and Patient connected to nasal cannula oxygen  Post-op Assessment: Report given to RN and Post -op Vital signs reviewed and stable  Post vital signs: Reviewed and stable  Last Vitals:  Vitals Value Taken Time  BP 81/23 02/16/20 1024  Temp    Pulse 98 02/16/20 1025  Resp 18 02/16/20 1025  SpO2 100 % 02/16/20 1025  Vitals shown include unvalidated device data.  Last Pain:  Vitals:   02/16/20 0617  TempSrc: Oral  PainSc: 10-Worst pain ever      Patients Stated Pain Goal: 3 (37/94/44 6190)  Complications: No complications documented.

## 2020-02-17 ENCOUNTER — Encounter (HOSPITAL_COMMUNITY): Payer: Self-pay | Admitting: Neurological Surgery

## 2020-04-08 DIAGNOSIS — M5126 Other intervertebral disc displacement, lumbar region: Secondary | ICD-10-CM | POA: Diagnosis not present

## 2020-04-08 DIAGNOSIS — M25551 Pain in right hip: Secondary | ICD-10-CM | POA: Diagnosis not present

## 2020-04-08 DIAGNOSIS — R531 Weakness: Secondary | ICD-10-CM | POA: Diagnosis not present

## 2020-04-08 DIAGNOSIS — M25651 Stiffness of right hip, not elsewhere classified: Secondary | ICD-10-CM | POA: Diagnosis not present

## 2020-04-09 DIAGNOSIS — E1140 Type 2 diabetes mellitus with diabetic neuropathy, unspecified: Secondary | ICD-10-CM | POA: Diagnosis not present

## 2020-04-09 DIAGNOSIS — J449 Chronic obstructive pulmonary disease, unspecified: Secondary | ICD-10-CM | POA: Diagnosis not present

## 2020-04-09 DIAGNOSIS — E1169 Type 2 diabetes mellitus with other specified complication: Secondary | ICD-10-CM | POA: Diagnosis not present

## 2020-04-09 DIAGNOSIS — F339 Major depressive disorder, recurrent, unspecified: Secondary | ICD-10-CM | POA: Diagnosis not present

## 2020-04-10 DIAGNOSIS — M25651 Stiffness of right hip, not elsewhere classified: Secondary | ICD-10-CM | POA: Diagnosis not present

## 2020-04-10 DIAGNOSIS — M5126 Other intervertebral disc displacement, lumbar region: Secondary | ICD-10-CM | POA: Diagnosis not present

## 2020-04-10 DIAGNOSIS — R531 Weakness: Secondary | ICD-10-CM | POA: Diagnosis not present

## 2020-04-10 DIAGNOSIS — M25551 Pain in right hip: Secondary | ICD-10-CM | POA: Diagnosis not present

## 2020-04-11 DIAGNOSIS — M25651 Stiffness of right hip, not elsewhere classified: Secondary | ICD-10-CM | POA: Diagnosis not present

## 2020-04-11 DIAGNOSIS — M5126 Other intervertebral disc displacement, lumbar region: Secondary | ICD-10-CM | POA: Diagnosis not present

## 2020-04-11 DIAGNOSIS — R531 Weakness: Secondary | ICD-10-CM | POA: Diagnosis not present

## 2020-04-11 DIAGNOSIS — M25551 Pain in right hip: Secondary | ICD-10-CM | POA: Diagnosis not present

## 2020-04-15 DIAGNOSIS — M25551 Pain in right hip: Secondary | ICD-10-CM | POA: Diagnosis not present

## 2020-04-15 DIAGNOSIS — R531 Weakness: Secondary | ICD-10-CM | POA: Diagnosis not present

## 2020-04-15 DIAGNOSIS — M25651 Stiffness of right hip, not elsewhere classified: Secondary | ICD-10-CM | POA: Diagnosis not present

## 2020-04-15 DIAGNOSIS — M5126 Other intervertebral disc displacement, lumbar region: Secondary | ICD-10-CM | POA: Diagnosis not present

## 2020-04-16 DIAGNOSIS — M25551 Pain in right hip: Secondary | ICD-10-CM | POA: Diagnosis not present

## 2020-04-16 DIAGNOSIS — M5126 Other intervertebral disc displacement, lumbar region: Secondary | ICD-10-CM | POA: Diagnosis not present

## 2020-04-16 DIAGNOSIS — R531 Weakness: Secondary | ICD-10-CM | POA: Diagnosis not present

## 2020-04-16 DIAGNOSIS — M25651 Stiffness of right hip, not elsewhere classified: Secondary | ICD-10-CM | POA: Diagnosis not present

## 2020-04-17 DIAGNOSIS — M25551 Pain in right hip: Secondary | ICD-10-CM | POA: Diagnosis not present

## 2020-04-17 DIAGNOSIS — M25651 Stiffness of right hip, not elsewhere classified: Secondary | ICD-10-CM | POA: Diagnosis not present

## 2020-04-17 DIAGNOSIS — M5126 Other intervertebral disc displacement, lumbar region: Secondary | ICD-10-CM | POA: Diagnosis not present

## 2020-04-17 DIAGNOSIS — R531 Weakness: Secondary | ICD-10-CM | POA: Diagnosis not present

## 2020-04-18 DIAGNOSIS — E1169 Type 2 diabetes mellitus with other specified complication: Secondary | ICD-10-CM | POA: Diagnosis not present

## 2020-04-25 DIAGNOSIS — E1169 Type 2 diabetes mellitus with other specified complication: Secondary | ICD-10-CM | POA: Diagnosis not present

## 2020-04-25 DIAGNOSIS — E782 Mixed hyperlipidemia: Secondary | ICD-10-CM | POA: Diagnosis not present

## 2020-04-25 DIAGNOSIS — J449 Chronic obstructive pulmonary disease, unspecified: Secondary | ICD-10-CM | POA: Diagnosis not present

## 2020-04-25 DIAGNOSIS — Z23 Encounter for immunization: Secondary | ICD-10-CM | POA: Diagnosis not present

## 2020-04-25 DIAGNOSIS — I10 Essential (primary) hypertension: Secondary | ICD-10-CM | POA: Diagnosis not present

## 2020-04-26 DIAGNOSIS — M25551 Pain in right hip: Secondary | ICD-10-CM | POA: Diagnosis not present

## 2020-04-26 DIAGNOSIS — R531 Weakness: Secondary | ICD-10-CM | POA: Diagnosis not present

## 2020-04-26 DIAGNOSIS — M25651 Stiffness of right hip, not elsewhere classified: Secondary | ICD-10-CM | POA: Diagnosis not present

## 2020-04-26 DIAGNOSIS — M5126 Other intervertebral disc displacement, lumbar region: Secondary | ICD-10-CM | POA: Diagnosis not present

## 2020-04-29 DIAGNOSIS — M5126 Other intervertebral disc displacement, lumbar region: Secondary | ICD-10-CM | POA: Diagnosis not present

## 2020-04-29 DIAGNOSIS — M25551 Pain in right hip: Secondary | ICD-10-CM | POA: Diagnosis not present

## 2020-04-29 DIAGNOSIS — R531 Weakness: Secondary | ICD-10-CM | POA: Diagnosis not present

## 2020-04-29 DIAGNOSIS — M25651 Stiffness of right hip, not elsewhere classified: Secondary | ICD-10-CM | POA: Diagnosis not present

## 2020-05-01 DIAGNOSIS — R531 Weakness: Secondary | ICD-10-CM | POA: Diagnosis not present

## 2020-05-01 DIAGNOSIS — M25551 Pain in right hip: Secondary | ICD-10-CM | POA: Diagnosis not present

## 2020-05-01 DIAGNOSIS — M5126 Other intervertebral disc displacement, lumbar region: Secondary | ICD-10-CM | POA: Diagnosis not present

## 2020-05-01 DIAGNOSIS — M25651 Stiffness of right hip, not elsewhere classified: Secondary | ICD-10-CM | POA: Diagnosis not present

## 2020-05-03 DIAGNOSIS — M25651 Stiffness of right hip, not elsewhere classified: Secondary | ICD-10-CM | POA: Diagnosis not present

## 2020-05-03 DIAGNOSIS — R531 Weakness: Secondary | ICD-10-CM | POA: Diagnosis not present

## 2020-05-03 DIAGNOSIS — M25551 Pain in right hip: Secondary | ICD-10-CM | POA: Diagnosis not present

## 2020-05-03 DIAGNOSIS — M5126 Other intervertebral disc displacement, lumbar region: Secondary | ICD-10-CM | POA: Diagnosis not present

## 2020-05-07 DIAGNOSIS — I10 Essential (primary) hypertension: Secondary | ICD-10-CM | POA: Diagnosis not present

## 2020-05-07 DIAGNOSIS — E1169 Type 2 diabetes mellitus with other specified complication: Secondary | ICD-10-CM | POA: Diagnosis not present

## 2020-05-07 DIAGNOSIS — J449 Chronic obstructive pulmonary disease, unspecified: Secondary | ICD-10-CM | POA: Diagnosis not present

## 2020-05-07 DIAGNOSIS — E782 Mixed hyperlipidemia: Secondary | ICD-10-CM | POA: Diagnosis not present

## 2020-06-07 DIAGNOSIS — E782 Mixed hyperlipidemia: Secondary | ICD-10-CM | POA: Diagnosis not present

## 2020-06-07 DIAGNOSIS — E1169 Type 2 diabetes mellitus with other specified complication: Secondary | ICD-10-CM | POA: Diagnosis not present

## 2020-06-07 DIAGNOSIS — J449 Chronic obstructive pulmonary disease, unspecified: Secondary | ICD-10-CM | POA: Diagnosis not present

## 2020-06-07 DIAGNOSIS — I10 Essential (primary) hypertension: Secondary | ICD-10-CM | POA: Diagnosis not present

## 2020-07-05 DIAGNOSIS — M25551 Pain in right hip: Secondary | ICD-10-CM | POA: Diagnosis not present

## 2020-07-05 DIAGNOSIS — Z6825 Body mass index (BMI) 25.0-25.9, adult: Secondary | ICD-10-CM | POA: Diagnosis not present

## 2020-07-05 DIAGNOSIS — N1832 Chronic kidney disease, stage 3b: Secondary | ICD-10-CM | POA: Diagnosis not present

## 2020-07-07 DIAGNOSIS — E1169 Type 2 diabetes mellitus with other specified complication: Secondary | ICD-10-CM | POA: Diagnosis not present

## 2020-07-07 DIAGNOSIS — E1140 Type 2 diabetes mellitus with diabetic neuropathy, unspecified: Secondary | ICD-10-CM | POA: Diagnosis not present

## 2020-07-07 DIAGNOSIS — J449 Chronic obstructive pulmonary disease, unspecified: Secondary | ICD-10-CM | POA: Diagnosis not present

## 2020-07-24 DIAGNOSIS — M5126 Other intervertebral disc displacement, lumbar region: Secondary | ICD-10-CM | POA: Diagnosis not present

## 2020-07-29 DIAGNOSIS — M7061 Trochanteric bursitis, right hip: Secondary | ICD-10-CM | POA: Diagnosis not present

## 2020-08-07 DIAGNOSIS — J449 Chronic obstructive pulmonary disease, unspecified: Secondary | ICD-10-CM | POA: Diagnosis not present

## 2020-08-07 DIAGNOSIS — E1169 Type 2 diabetes mellitus with other specified complication: Secondary | ICD-10-CM | POA: Diagnosis not present

## 2020-08-07 DIAGNOSIS — E1140 Type 2 diabetes mellitus with diabetic neuropathy, unspecified: Secondary | ICD-10-CM | POA: Diagnosis not present

## 2020-08-12 DIAGNOSIS — M25551 Pain in right hip: Secondary | ICD-10-CM | POA: Diagnosis not present

## 2020-08-12 DIAGNOSIS — R531 Weakness: Secondary | ICD-10-CM | POA: Diagnosis not present

## 2020-08-12 DIAGNOSIS — M7061 Trochanteric bursitis, right hip: Secondary | ICD-10-CM | POA: Diagnosis not present

## 2020-08-14 DIAGNOSIS — M25551 Pain in right hip: Secondary | ICD-10-CM | POA: Diagnosis not present

## 2020-08-14 DIAGNOSIS — R531 Weakness: Secondary | ICD-10-CM | POA: Diagnosis not present

## 2020-08-14 DIAGNOSIS — M7061 Trochanteric bursitis, right hip: Secondary | ICD-10-CM | POA: Diagnosis not present

## 2020-08-14 DIAGNOSIS — R262 Difficulty in walking, not elsewhere classified: Secondary | ICD-10-CM | POA: Diagnosis not present

## 2020-08-15 DIAGNOSIS — E1169 Type 2 diabetes mellitus with other specified complication: Secondary | ICD-10-CM | POA: Diagnosis not present

## 2020-08-19 DIAGNOSIS — R262 Difficulty in walking, not elsewhere classified: Secondary | ICD-10-CM | POA: Diagnosis not present

## 2020-08-19 DIAGNOSIS — M7061 Trochanteric bursitis, right hip: Secondary | ICD-10-CM | POA: Diagnosis not present

## 2020-08-19 DIAGNOSIS — M25551 Pain in right hip: Secondary | ICD-10-CM | POA: Diagnosis not present

## 2020-08-19 DIAGNOSIS — R531 Weakness: Secondary | ICD-10-CM | POA: Diagnosis not present

## 2020-08-21 DIAGNOSIS — R262 Difficulty in walking, not elsewhere classified: Secondary | ICD-10-CM | POA: Diagnosis not present

## 2020-08-21 DIAGNOSIS — R531 Weakness: Secondary | ICD-10-CM | POA: Diagnosis not present

## 2020-08-21 DIAGNOSIS — M25551 Pain in right hip: Secondary | ICD-10-CM | POA: Diagnosis not present

## 2020-08-21 DIAGNOSIS — M7061 Trochanteric bursitis, right hip: Secondary | ICD-10-CM | POA: Diagnosis not present

## 2020-08-23 ENCOUNTER — Other Ambulatory Visit: Payer: Self-pay | Admitting: Family Medicine

## 2020-08-23 DIAGNOSIS — Z139 Encounter for screening, unspecified: Secondary | ICD-10-CM

## 2020-08-26 DIAGNOSIS — R262 Difficulty in walking, not elsewhere classified: Secondary | ICD-10-CM | POA: Diagnosis not present

## 2020-08-26 DIAGNOSIS — M25551 Pain in right hip: Secondary | ICD-10-CM | POA: Diagnosis not present

## 2020-08-26 DIAGNOSIS — M7061 Trochanteric bursitis, right hip: Secondary | ICD-10-CM | POA: Diagnosis not present

## 2020-08-26 DIAGNOSIS — R531 Weakness: Secondary | ICD-10-CM | POA: Diagnosis not present

## 2020-08-28 DIAGNOSIS — M7061 Trochanteric bursitis, right hip: Secondary | ICD-10-CM | POA: Diagnosis not present

## 2020-08-28 DIAGNOSIS — M25551 Pain in right hip: Secondary | ICD-10-CM | POA: Diagnosis not present

## 2020-08-28 DIAGNOSIS — R262 Difficulty in walking, not elsewhere classified: Secondary | ICD-10-CM | POA: Diagnosis not present

## 2020-08-28 DIAGNOSIS — R531 Weakness: Secondary | ICD-10-CM | POA: Diagnosis not present

## 2020-08-29 DIAGNOSIS — E782 Mixed hyperlipidemia: Secondary | ICD-10-CM | POA: Diagnosis not present

## 2020-08-29 DIAGNOSIS — N183 Chronic kidney disease, stage 3 unspecified: Secondary | ICD-10-CM | POA: Diagnosis not present

## 2020-08-29 DIAGNOSIS — E1169 Type 2 diabetes mellitus with other specified complication: Secondary | ICD-10-CM | POA: Diagnosis not present

## 2020-08-29 DIAGNOSIS — I129 Hypertensive chronic kidney disease with stage 1 through stage 4 chronic kidney disease, or unspecified chronic kidney disease: Secondary | ICD-10-CM | POA: Diagnosis not present

## 2020-09-03 DIAGNOSIS — R531 Weakness: Secondary | ICD-10-CM | POA: Diagnosis not present

## 2020-09-03 DIAGNOSIS — M7061 Trochanteric bursitis, right hip: Secondary | ICD-10-CM | POA: Diagnosis not present

## 2020-09-03 DIAGNOSIS — M25551 Pain in right hip: Secondary | ICD-10-CM | POA: Diagnosis not present

## 2020-09-03 DIAGNOSIS — R262 Difficulty in walking, not elsewhere classified: Secondary | ICD-10-CM | POA: Diagnosis not present

## 2020-09-05 DIAGNOSIS — M7061 Trochanteric bursitis, right hip: Secondary | ICD-10-CM | POA: Diagnosis not present

## 2020-09-05 DIAGNOSIS — M25551 Pain in right hip: Secondary | ICD-10-CM | POA: Diagnosis not present

## 2020-09-05 DIAGNOSIS — R262 Difficulty in walking, not elsewhere classified: Secondary | ICD-10-CM | POA: Diagnosis not present

## 2020-09-05 DIAGNOSIS — R531 Weakness: Secondary | ICD-10-CM | POA: Diagnosis not present

## 2020-09-06 DIAGNOSIS — E1169 Type 2 diabetes mellitus with other specified complication: Secondary | ICD-10-CM | POA: Diagnosis not present

## 2020-09-06 DIAGNOSIS — I129 Hypertensive chronic kidney disease with stage 1 through stage 4 chronic kidney disease, or unspecified chronic kidney disease: Secondary | ICD-10-CM | POA: Diagnosis not present

## 2020-09-06 DIAGNOSIS — E782 Mixed hyperlipidemia: Secondary | ICD-10-CM | POA: Diagnosis not present

## 2020-09-17 DIAGNOSIS — R262 Difficulty in walking, not elsewhere classified: Secondary | ICD-10-CM | POA: Diagnosis not present

## 2020-09-17 DIAGNOSIS — M7061 Trochanteric bursitis, right hip: Secondary | ICD-10-CM | POA: Diagnosis not present

## 2020-09-17 DIAGNOSIS — R531 Weakness: Secondary | ICD-10-CM | POA: Diagnosis not present

## 2020-09-17 DIAGNOSIS — M25551 Pain in right hip: Secondary | ICD-10-CM | POA: Diagnosis not present

## 2020-09-18 ENCOUNTER — Other Ambulatory Visit: Payer: Self-pay

## 2020-09-18 ENCOUNTER — Ambulatory Visit
Admission: RE | Admit: 2020-09-18 | Discharge: 2020-09-18 | Disposition: A | Discharge status: Home / Self Care | Payer: Medicare Other | Source: Ambulatory Visit | Attending: Family Medicine | Admitting: Family Medicine

## 2020-09-18 DIAGNOSIS — Z139 Encounter for screening, unspecified: Secondary | ICD-10-CM

## 2020-09-18 DIAGNOSIS — Z1231 Encounter for screening mammogram for malignant neoplasm of breast: Secondary | ICD-10-CM | POA: Diagnosis not present

## 2020-09-19 DIAGNOSIS — M25551 Pain in right hip: Secondary | ICD-10-CM | POA: Diagnosis not present

## 2020-09-19 DIAGNOSIS — R262 Difficulty in walking, not elsewhere classified: Secondary | ICD-10-CM | POA: Diagnosis not present

## 2020-09-19 DIAGNOSIS — R531 Weakness: Secondary | ICD-10-CM | POA: Diagnosis not present

## 2020-09-19 DIAGNOSIS — M7061 Trochanteric bursitis, right hip: Secondary | ICD-10-CM | POA: Diagnosis not present

## 2020-09-23 DIAGNOSIS — R531 Weakness: Secondary | ICD-10-CM | POA: Diagnosis not present

## 2020-09-23 DIAGNOSIS — M25551 Pain in right hip: Secondary | ICD-10-CM | POA: Diagnosis not present

## 2020-09-23 DIAGNOSIS — R262 Difficulty in walking, not elsewhere classified: Secondary | ICD-10-CM | POA: Diagnosis not present

## 2020-09-23 DIAGNOSIS — M7061 Trochanteric bursitis, right hip: Secondary | ICD-10-CM | POA: Diagnosis not present

## 2020-09-25 DIAGNOSIS — M25551 Pain in right hip: Secondary | ICD-10-CM | POA: Diagnosis not present

## 2020-09-25 DIAGNOSIS — R531 Weakness: Secondary | ICD-10-CM | POA: Diagnosis not present

## 2020-09-25 DIAGNOSIS — R262 Difficulty in walking, not elsewhere classified: Secondary | ICD-10-CM | POA: Diagnosis not present

## 2020-09-25 DIAGNOSIS — M7061 Trochanteric bursitis, right hip: Secondary | ICD-10-CM | POA: Diagnosis not present

## 2020-10-03 DIAGNOSIS — Z01419 Encounter for gynecological examination (general) (routine) without abnormal findings: Secondary | ICD-10-CM | POA: Diagnosis not present

## 2020-10-03 DIAGNOSIS — J449 Chronic obstructive pulmonary disease, unspecified: Secondary | ICD-10-CM | POA: Diagnosis not present

## 2020-10-03 DIAGNOSIS — F17200 Nicotine dependence, unspecified, uncomplicated: Secondary | ICD-10-CM | POA: Diagnosis not present

## 2020-10-03 DIAGNOSIS — Z6825 Body mass index (BMI) 25.0-25.9, adult: Secondary | ICD-10-CM | POA: Diagnosis not present

## 2020-10-04 DIAGNOSIS — R55 Syncope and collapse: Secondary | ICD-10-CM | POA: Diagnosis not present

## 2020-10-04 DIAGNOSIS — N189 Chronic kidney disease, unspecified: Secondary | ICD-10-CM | POA: Diagnosis not present

## 2020-10-04 DIAGNOSIS — Z7982 Long term (current) use of aspirin: Secondary | ICD-10-CM | POA: Diagnosis not present

## 2020-10-04 DIAGNOSIS — R42 Dizziness and giddiness: Secondary | ICD-10-CM | POA: Diagnosis not present

## 2020-10-04 DIAGNOSIS — I959 Hypotension, unspecified: Secondary | ICD-10-CM | POA: Diagnosis not present

## 2020-10-04 DIAGNOSIS — E1122 Type 2 diabetes mellitus with diabetic chronic kidney disease: Secondary | ICD-10-CM | POA: Diagnosis not present

## 2020-10-04 DIAGNOSIS — F17210 Nicotine dependence, cigarettes, uncomplicated: Secondary | ICD-10-CM | POA: Diagnosis not present

## 2020-10-04 DIAGNOSIS — R0902 Hypoxemia: Secondary | ICD-10-CM | POA: Diagnosis not present

## 2020-10-04 DIAGNOSIS — I129 Hypertensive chronic kidney disease with stage 1 through stage 4 chronic kidney disease, or unspecified chronic kidney disease: Secondary | ICD-10-CM | POA: Diagnosis not present

## 2020-10-04 DIAGNOSIS — Z79899 Other long term (current) drug therapy: Secondary | ICD-10-CM | POA: Diagnosis not present

## 2020-10-04 DIAGNOSIS — R0989 Other specified symptoms and signs involving the circulatory and respiratory systems: Secondary | ICD-10-CM | POA: Diagnosis not present

## 2020-10-07 DIAGNOSIS — E1169 Type 2 diabetes mellitus with other specified complication: Secondary | ICD-10-CM | POA: Diagnosis not present

## 2020-10-07 DIAGNOSIS — I129 Hypertensive chronic kidney disease with stage 1 through stage 4 chronic kidney disease, or unspecified chronic kidney disease: Secondary | ICD-10-CM | POA: Diagnosis not present

## 2020-10-07 DIAGNOSIS — E782 Mixed hyperlipidemia: Secondary | ICD-10-CM | POA: Diagnosis not present

## 2020-10-08 DIAGNOSIS — U071 COVID-19: Secondary | ICD-10-CM | POA: Diagnosis not present

## 2020-10-08 DIAGNOSIS — Z20822 Contact with and (suspected) exposure to covid-19: Secondary | ICD-10-CM | POA: Diagnosis not present

## 2020-10-28 DIAGNOSIS — Z87891 Personal history of nicotine dependence: Secondary | ICD-10-CM | POA: Diagnosis not present

## 2020-10-28 DIAGNOSIS — F17210 Nicotine dependence, cigarettes, uncomplicated: Secondary | ICD-10-CM | POA: Diagnosis not present

## 2020-10-28 DIAGNOSIS — Z122 Encounter for screening for malignant neoplasm of respiratory organs: Secondary | ICD-10-CM | POA: Diagnosis not present

## 2020-11-01 DIAGNOSIS — K2289 Other specified disease of esophagus: Secondary | ICD-10-CM | POA: Diagnosis not present

## 2020-11-01 DIAGNOSIS — Z23 Encounter for immunization: Secondary | ICD-10-CM | POA: Diagnosis not present

## 2020-11-01 DIAGNOSIS — Z6825 Body mass index (BMI) 25.0-25.9, adult: Secondary | ICD-10-CM | POA: Diagnosis not present

## 2020-11-07 DIAGNOSIS — I129 Hypertensive chronic kidney disease with stage 1 through stage 4 chronic kidney disease, or unspecified chronic kidney disease: Secondary | ICD-10-CM | POA: Diagnosis not present

## 2020-11-07 DIAGNOSIS — E1169 Type 2 diabetes mellitus with other specified complication: Secondary | ICD-10-CM | POA: Diagnosis not present

## 2020-11-07 DIAGNOSIS — E782 Mixed hyperlipidemia: Secondary | ICD-10-CM | POA: Diagnosis not present

## 2020-11-08 ENCOUNTER — Ambulatory Visit (INDEPENDENT_AMBULATORY_CARE_PROVIDER_SITE_OTHER): Payer: Medicare Other | Admitting: Gastroenterology

## 2020-11-08 ENCOUNTER — Encounter: Payer: Self-pay | Admitting: Gastroenterology

## 2020-11-08 ENCOUNTER — Other Ambulatory Visit: Payer: Self-pay

## 2020-11-08 VITALS — BP 122/70 | HR 64 | Ht 61.0 in | Wt 137.0 lb

## 2020-11-08 DIAGNOSIS — R1310 Dysphagia, unspecified: Secondary | ICD-10-CM | POA: Diagnosis not present

## 2020-11-08 DIAGNOSIS — K219 Gastro-esophageal reflux disease without esophagitis: Secondary | ICD-10-CM

## 2020-11-08 MED ORDER — OMEPRAZOLE 20 MG PO CPDR: 20.0000 mg | DELAYED_RELEASE_CAPSULE | Freq: Every day | ORAL | 3 refills | Status: DC

## 2020-11-08 NOTE — Progress Notes (Signed)
Chief Complaint: Abnormal CT chest  Referring Provider: Dr. Marco Collie      ASSESSMENT AND PLAN;   #1. GERD with long standing occ dysphagia with previously diagnosed HH, ?EoE  #2. Abn CT chest showing 3.2 into 2.1 cm soft tissue mass in the upper thoracic esophagus, mildly increased since 2017.  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  On review it could also represent duplication cyst.  #3.  COPD with continued smoking.  Plan: -Notes from Dr Nyra Capes -Ba swallow with barium tablet. -EGD (patient wants to wait until sept or early oct 2022) -Continue omeprazole 35m po QD #30, 11 refiils -Discussed with patient's family. -Instructed to continue to swallow tablets carefully as she has been doing for several years.   HPI:    [name] Bruggeris a 75y.o. 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  Per patient she has been having problems swallowing pills x 20 years.  She is very careful.  Not having any problems with food.  She denies having any heartburn, odynophagia, cough, unintentional weight loss or loss of appetite.  She lost weight intentionally (20lb over 3 yrs 2016)when she was diagnosed as having fatty liver in the past.  Her most recent liver tests were normal.  In November 2014 she had EGD which showed proximal esophageal stricture with circumferential esophagitis.  Hiatal hernia and small caliber esophagus.  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  Distal biopsies did not show significant eosinophils.  Brushings showed atypical squamous cells which were likely consistent with recent pill induced esophagitis.  She denies having any melena or hematochezia.  No diarrhea or  constipation.  Continues to smoke despite medical advice.  Wt Readings from Last 3 Encounters:  11/08/20 137 lb (62.1 kg)  02/16/20 140 lb 6.4 oz (63.7 kg)  02/13/20 140 lb 6.4 oz (63.7 kg)   Past GI work-up:  EGD 01/11/2013 -Proximal esophageal stricture with circumferential esophagitis, could be due to pill induced esophagitis or eosinophilic esophagitis -3 cm hiatal hernia. -Small caliber esophagus. -Dilated to 12 mm TTS balloon. -Bx: Px eso: Benign squamous mucosa with epithelial hyperplasia, parakeratosis, intraepithelial neutrophils and eosinophils up to 19 per HF, past negative for fungi.  Distal biopsies: Benign squamous mucosa with epithelial hyperplasia, parakeratosis, intraepithelial eosinophils up to 3 per hpf.  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 -Treated with steroid inhalers  Colonoscopy 01/2017 -Colon polyp s/p polypectomy -Mild sigmoid diverticulosis -Bx-hyperplastic -Repeat in 10 years.  Earlier if with problems.  H/O fatty liver 2015. Nl ANA, ceruloplasmin, GGT, acute hepatitis profile, celiac screen, AMA. S/P vaccines for hepatitis A/B January 2016  CT chest for lung cancer screening October 28, 2020 -Lungs RADS 2 -Lobulated 3.2 into 2.1 cm soft tissue mass in the upper thoracic esophagus, mildly increased since 2017.  Suspicious for indolent esophageal neoplasm. -Emphysema -Aortic atherosclerosis  S/p cholecystectomy 07/2004 by Dr. GPauletta Browns  Cholecystitis and cholelithiasis.   Past Medical History:  Diagnosis Date   Anemia 2016   Chronic kidney disease    Complication of anesthesia    Hard to wake up   COPD (chronic obstructive pulmonary disease) ([place]    Depression    Diabetes mellitus without complication ([place]    Family history  of adverse reaction to anesthesia    Father had post op N & V   Hyperlipidemia    Hypertension    Osteoarthritis    PONV (postoperative nausea and vomiting)     Past Surgical History:  Procedure  Laterality Date   ADENOIDECTOMY     APPENDECTOMY     CHOLECYSTECTOMY     COLONOSCOPY  12/24/2016   Colonic polyp status post polypectomy. Mild sigmoid diverticulosis   EYE SURGERY Bilateral    implants for cataracts   LUMBAR LAMINECTOMY/ DECOMPRESSION WITH MET-RX Right 02/16/2020   Procedure: MIS MICRODISCECTOMY RIGHT LUMBAR THREE-FOUR;  Surgeon: Dawley, Theodoro Doing, DO;  Location: [facility];  Service: Neurosurgery;  Laterality: Right;  posterior   neuroplasty decompression     median nerve at carpal tunnel   TONSILLECTOMY     TUBAL LIGATION      Family History  Problem Relation Age of Onset   Colon cancer Neg Hx    Esophageal cancer Neg Hx    Pancreatic cancer Neg Hx    Liver cancer Neg Hx    Stomach cancer Neg Hx     Social History   Tobacco Use   Smoking status: Every Day    Packs/day: 0.50    Years: 52.00    Pack years: 26.00    Types: Cigarettes   Smokeless tobacco: Never  Vaping Use   Vaping Use: Never used  Substance Use Topics   Alcohol use: Not Currently   Drug use: Never    Current Outpatient Medications  Medication Sig Dispense Refill   aspirin 81 MG chewable tablet Chew by mouth. Once daily     Cholecalciferol (VITAMIN D3) 1.25 MG (50000 UT) TABS Take 1 tablet by mouth daily.     Cyanocobalamin (B-12) 5000 MCG SUBL Once daily     empagliflozin (JARDIANCE) 25 MG TABS tablet Take 25 mg by mouth daily.     escitalopram (LEXAPRO) 10 MG tablet Take 10 mg by mouth daily.     FeFum-FePoly-FA-B Cmp-C-Biot (INTEGRA PLUS) CAPS Take by mouth.     Omega-3 Fatty Acids (FISH OIL) 1000 MG CAPS      rosuvastatin (CRESTOR) 20 MG tablet Take 20 mg by mouth daily.     telmisartan (MICARDIS) 40 MG tablet Take 40 mg by mouth daily.     tiZANidine (ZANAFLEX) 2 MG tablet TAKE 1 TABLET (2 MG TOTAL) BY MOUTH EVERY EIGHT HOURS AS NEEDED FOR MUSCLE SPASMS. 90 tablet 1   No current facility-administered medications for this visit.    Allergies  Allergen Reactions   Amoxicillin-Pot  Clavulanate Rash    Review of Systems:  Constitutional: Denies fever, chills, diaphoresis, appetite change and fatigue.  HEENT: Denies photophobia, eye pain, redness, hearing loss, ear pain, congestion, sore throat, rhinorrhea, sneezing, mouth sores, neck pain, neck stiffness and tinnitus.   Respiratory: Denies SOB, DOE, cough, chest tightness,  and wheezing.   Cardiovascular: Denies chest pain, palpitations and leg swelling.  Genitourinary: Denies dysuria, urgency, frequency, hematuria, flank pain and difficulty urinating.  Musculoskeletal: Denies myalgias, back pain, joint swelling, arthralgias and gait problem.  Skin: No rash.  Neurological: Denies dizziness, seizures, syncope, weakness, light-headedness, numbness and headaches.  Hematological: Denies adenopathy. Easy bruising, personal or family bleeding history  Psychiatric/Behavioral: Has anxiety or depression     Physical Exam:    BP 122/70   Pulse 64   Ht 5' 1"  (1.549 m)   Wt 137 lb (62.1 kg)   SpO2 96%   BMI 25.89  kg/m  Wt Readings from Last 3 Encounters:  11/08/20 137 lb (62.1 kg)  02/16/20 140 lb 6.4 oz (63.7 kg)  02/13/20 140 lb 6.4 oz (63.7 kg)   Constitutional:  Well-developed, in no acute distress. Psychiatric: Normal mood and affect. Behavior is normal. HEENT: Pupils normal.  Conjunctivae are normal. No scleral icterus. Neck supple.  Cardiovascular: Normal rate, regular rhythm. No edema Pulmonary/chest: Decreased breath sounds.  No wheezing, rales or rhonchi. Abdominal: Soft, nondistended. Nontender. Bowel sounds active throughout. There are no masses palpable. No hepatomegaly. Rectal: Deferred Neurological: Alert and oriented to person place and time. Skin: Skin is warm and dry. No rashes noted.  Data Reviewed: I have personally reviewed following labs and imaging studies  CBC: CBC Latest Ref Rng & Units 02/13/2020 05/17/2007  WBC 4.0 - 10.5 K/uL 8.3 -  Hemoglobin 12.0 - 15.0 g/dL 16.6(H) 13.5 POINT OF  CARE RESULT  Hematocrit 36.0 - 46.0 % 51.5(H) -  Platelets 150 - 400 K/uL 170 -    CMP: CMP Latest Ref Rng & Units 02/13/2020 05/16/2007  Glucose 70 - 99 mg/dL 120(H) 105(H)  BUN 8 - 23 mg/dL 19 28(H)  Creatinine 0.44 - 1.00 mg/dL 1.10(H) 1.49(H)  Sodium 135 - 145 mmol/L 135 140  Potassium 3.5 - 5.1 mmol/L 4.7 4.8  Chloride 98 - 111 mmol/L 104 103  CO2 22 - 32 mmol/L 18(L) 30  Calcium 8.9 - 10.3 mg/dL 9.6 10.6(H)      [name] Austria, MD 11/08/2020, 9:36 AM  Cc: Dr. Marco Collie.

## 2020-11-08 NOTE — Patient Instructions (Signed)
If you are age 73 or older, your body mass index should be between 23-30. Your Body mass index is 25.89 kg/m. If this is out of the aforementioned range listed, please consider follow up with your Primary Care Provider.  If you are age 84 or younger, your body mass index should be between 19-25. Your Body mass index is 25.89 kg/m. If this is out of the aformentioned range listed, please consider follow up with your Primary Care Provider.   __________________________________________________________  The [facility] providers would like to encourage you to use [facility] to communicate with providers for non-urgent requests or questions.  Due to long hold times on the telephone, sending your provider a message by [facility] may be a faster and more efficient way to get a response.  Please allow 48 business hours for a response.  Please remember that this is for non-urgent requests.   You have been scheduled for a Barium Esophogram at [facility] Radiology (1st floor of the hospital) on 12-06-2020 at 10:30am . Please arrive 15 minutes prior to your appointment for registration. Make certain not to have anything to eat or drink 4 hours prior to your test. If you need to reschedule for any reason, please contact radiology at [phone] to do so. __________________________________________________________________ A barium swallow is an examination that concentrates on views of the esophagus. This tends to be a double contrast exam (barium and two liquids which, when combined, create a gas to distend the wall of the oesophagus) or single contrast (non-ionic iodine based). The study is usually tailored to your symptoms so a good history is essential. Attention is paid during the study to the form, structure and configuration of the esophagus, looking for functional disorders (such as aspiration, dysphagia, achalasia, motility and reflux) EXAMINATION You may be asked to change into a gown, depending on the type of  swallow being performed. A radiologist and radiographer will perform the procedure. The radiologist will advise you of the type of contrast selected for your procedure and direct you during the exam. You will be asked to stand, sit or lie in several different positions and to hold a small amount of fluid in your mouth before being asked to swallow while the imaging is performed .In some instances you may be asked to swallow barium coated marshmallows to assess the motility of a solid food bolus. The exam can be recorded as a digital or video fluoroscopy procedure. POST PROCEDURE It will take 1-2 days for the barium to pass through your system. To facilitate this, it is important, unless otherwise directed, to increase your fluids for the next 24-48hrs and to resume your normal diet.  This test typically takes about 30 minutes to perform. __________________________________________________________________________________  Dennis Bast have been scheduled for an endoscopy. Please follow written instructions given to you at your visit today. If you use inhalers (even only as needed), please bring them with you on the day of your procedure.  We have sent the following medications to your pharmacy for you to pick up at your convenience: Prilosec  Thank you,  Dr. Jackquline Denmark

## 2020-12-06 ENCOUNTER — Ambulatory Visit (HOSPITAL_COMMUNITY)
Admission: RE | Admit: 2020-12-06 | Discharge: 2020-12-06 | Disposition: A | Discharge status: Home / Self Care | Payer: Medicare Other | Source: Ambulatory Visit | Attending: Gastroenterology | Admitting: Gastroenterology

## 2020-12-06 DIAGNOSIS — R1310 Dysphagia, unspecified: Secondary | ICD-10-CM | POA: Diagnosis not present

## 2020-12-06 DIAGNOSIS — K219 Gastro-esophageal reflux disease without esophagitis: Secondary | ICD-10-CM | POA: Diagnosis not present

## 2020-12-07 DIAGNOSIS — E1169 Type 2 diabetes mellitus with other specified complication: Secondary | ICD-10-CM | POA: Diagnosis not present

## 2020-12-07 DIAGNOSIS — E782 Mixed hyperlipidemia: Secondary | ICD-10-CM | POA: Diagnosis not present

## 2020-12-07 DIAGNOSIS — J449 Chronic obstructive pulmonary disease, unspecified: Secondary | ICD-10-CM | POA: Diagnosis not present

## 2020-12-09 NOTE — Progress Notes (Signed)
[name] [name], [name] your advice!  Mallery has long standing dysphagia CT Chest- 3.2 into 2.1 cm soft tissue mass in the upper thoracic [name], [name] increased since 2017 Ba Swallow: Extrinsic compression c/w submucosal or paraesophageal abn  Radiology- recommends EGD with [name]. She is scheduled for EGD with me.  Pt lives in [place]. Do [name] [name], [name] [name]/EGD?  Certainly, NOT urgent.  RG

## 2020-12-17 ENCOUNTER — Other Ambulatory Visit: Payer: Self-pay

## 2020-12-17 DIAGNOSIS — K2289 Other specified disease of esophagus: Secondary | ICD-10-CM

## 2020-12-20 ENCOUNTER — Encounter: Payer: Medicare Other | Admitting: Gastroenterology

## 2021-01-02 DIAGNOSIS — E1169 Type 2 diabetes mellitus with other specified complication: Secondary | ICD-10-CM | POA: Diagnosis not present

## 2021-01-07 DIAGNOSIS — E1169 Type 2 diabetes mellitus with other specified complication: Secondary | ICD-10-CM | POA: Diagnosis not present

## 2021-01-07 DIAGNOSIS — J449 Chronic obstructive pulmonary disease, unspecified: Secondary | ICD-10-CM | POA: Diagnosis not present

## 2021-01-07 DIAGNOSIS — E782 Mixed hyperlipidemia: Secondary | ICD-10-CM | POA: Diagnosis not present

## 2021-01-09 ENCOUNTER — Encounter (HOSPITAL_COMMUNITY): Payer: Self-pay | Admitting: Gastroenterology

## 2021-01-09 DIAGNOSIS — F17210 Nicotine dependence, cigarettes, uncomplicated: Secondary | ICD-10-CM | POA: Diagnosis not present

## 2021-01-09 DIAGNOSIS — Z139 Encounter for screening, unspecified: Secondary | ICD-10-CM | POA: Diagnosis not present

## 2021-01-09 DIAGNOSIS — Z720 Tobacco use: Secondary | ICD-10-CM | POA: Diagnosis not present

## 2021-01-09 DIAGNOSIS — I129 Hypertensive chronic kidney disease with stage 1 through stage 4 chronic kidney disease, or unspecified chronic kidney disease: Secondary | ICD-10-CM | POA: Diagnosis not present

## 2021-01-09 DIAGNOSIS — Z1331 Encounter for screening for depression: Secondary | ICD-10-CM | POA: Diagnosis not present

## 2021-01-09 DIAGNOSIS — Z0000 Encounter for general adult medical examination without abnormal findings: Secondary | ICD-10-CM | POA: Diagnosis not present

## 2021-01-09 DIAGNOSIS — N183 Chronic kidney disease, stage 3 unspecified: Secondary | ICD-10-CM | POA: Diagnosis not present

## 2021-01-09 DIAGNOSIS — E1169 Type 2 diabetes mellitus with other specified complication: Secondary | ICD-10-CM | POA: Diagnosis not present

## 2021-01-09 DIAGNOSIS — Z136 Encounter for screening for cardiovascular disorders: Secondary | ICD-10-CM | POA: Diagnosis not present

## 2021-01-09 DIAGNOSIS — Z1339 Encounter for screening examination for other mental health and behavioral disorders: Secondary | ICD-10-CM | POA: Diagnosis not present

## 2021-01-09 DIAGNOSIS — E782 Mixed hyperlipidemia: Secondary | ICD-10-CM | POA: Diagnosis not present

## 2021-01-09 NOTE — Progress Notes (Signed)
Attempted to obtain medical history via telephone, unable to reach at this time. I left a voicemail to return pre surgical testing department's phone call.  

## 2021-01-20 ENCOUNTER — Other Ambulatory Visit: Payer: Self-pay

## 2021-01-20 ENCOUNTER — Ambulatory Visit (HOSPITAL_COMMUNITY): Payer: Medicare Other | Admitting: Anesthesiology

## 2021-01-20 ENCOUNTER — Encounter (HOSPITAL_COMMUNITY): Payer: Self-pay | Admitting: Gastroenterology

## 2021-01-20 ENCOUNTER — Ambulatory Visit (HOSPITAL_COMMUNITY)
Admission: RE | Admit: 2021-01-20 | Discharge: 2021-01-20 | Disposition: A | Discharge status: Home / Self Care | Payer: Medicare Other | Source: Ambulatory Visit | Attending: Gastroenterology | Admitting: Gastroenterology

## 2021-01-20 ENCOUNTER — Encounter (HOSPITAL_COMMUNITY)
Admission: RE | Disposition: A | Discharge status: Home / Self Care | Payer: Self-pay | Source: Ambulatory Visit | Attending: Gastroenterology

## 2021-01-20 DIAGNOSIS — F17210 Nicotine dependence, cigarettes, uncomplicated: Secondary | ICD-10-CM | POA: Diagnosis not present

## 2021-01-20 DIAGNOSIS — K2970 Gastritis, unspecified, without bleeding: Secondary | ICD-10-CM

## 2021-01-20 DIAGNOSIS — K2100 Gastro-esophageal reflux disease with esophagitis, without bleeding: Secondary | ICD-10-CM | POA: Insufficient documentation

## 2021-01-20 DIAGNOSIS — K222 Esophageal obstruction: Secondary | ICD-10-CM | POA: Diagnosis not present

## 2021-01-20 DIAGNOSIS — K229 Disease of esophagus, unspecified: Secondary | ICD-10-CM | POA: Diagnosis not present

## 2021-01-20 DIAGNOSIS — K449 Diaphragmatic hernia without obstruction or gangrene: Secondary | ICD-10-CM | POA: Diagnosis not present

## 2021-01-20 DIAGNOSIS — K2289 Other specified disease of esophagus: Secondary | ICD-10-CM | POA: Diagnosis not present

## 2021-01-20 DIAGNOSIS — K2950 Unspecified chronic gastritis without bleeding: Secondary | ICD-10-CM | POA: Diagnosis not present

## 2021-01-20 DIAGNOSIS — K2990 Gastroduodenitis, unspecified, without bleeding: Secondary | ICD-10-CM | POA: Diagnosis not present

## 2021-01-20 DIAGNOSIS — R933 Abnormal findings on diagnostic imaging of other parts of digestive tract: Secondary | ICD-10-CM | POA: Diagnosis present

## 2021-01-20 DIAGNOSIS — K209 Esophagitis, unspecified without bleeding: Secondary | ICD-10-CM | POA: Diagnosis not present

## 2021-01-20 DIAGNOSIS — R1310 Dysphagia, unspecified: Secondary | ICD-10-CM | POA: Diagnosis not present

## 2021-01-20 DIAGNOSIS — K2980 Duodenitis without bleeding: Secondary | ICD-10-CM | POA: Diagnosis not present

## 2021-01-20 HISTORY — PX: ESOPHAGOGASTRODUODENOSCOPY (EGD) WITH PROPOFOL: SHX5813

## 2021-01-20 HISTORY — PX: BIOPSY: SHX5522

## 2021-01-20 HISTORY — PX: FINE NEEDLE ASPIRATION: SHX5430

## 2021-01-20 HISTORY — PX: EUS: SHX5427

## 2021-01-20 HISTORY — PX: SAVORY DILATION: SHX5439

## 2021-01-20 LAB — GLUCOSE, CAPILLARY: Glucose-Capillary: 121 mg/dL — ABNORMAL HIGH (ref 70–99)

## 2021-01-20 SURGERY — UPPER ENDOSCOPIC ULTRASOUND (EUS) RADIAL
Anesthesia: Monitor Anesthesia Care

## 2021-01-20 MED ORDER — PROPOFOL 10 MG/ML IV BOLUS
INTRAVENOUS | Status: DC | PRN
  Administered 2021-01-20: 50 mg via INTRAVENOUS
  Administered 2021-01-20: 30 mg via INTRAVENOUS

## 2021-01-20 MED ORDER — LIDOCAINE 2% (20 MG/ML) 5 ML SYRINGE
INTRAMUSCULAR | Status: DC | PRN
  Administered 2021-01-20: 60 mg via INTRAVENOUS

## 2021-01-20 MED ORDER — CIPROFLOXACIN IN D5W 400 MG/200ML IV SOLN
INTRAVENOUS | Status: AC
  Filled 2021-01-20: qty 200

## 2021-01-20 MED ORDER — LACTATED RINGERS IV SOLN
INTRAVENOUS | Status: DC
  Administered 2021-01-20: 1000 mL via INTRAVENOUS

## 2021-01-20 MED ORDER — PHENYLEPHRINE HCL (PRESSORS) 10 MG/ML IV SOLN
INTRAVENOUS | Status: DC | PRN
  Administered 2021-01-20 (×2): 160 ug via INTRAVENOUS
  Administered 2021-01-20: 120 ug via INTRAVENOUS
  Administered 2021-01-20: 160 ug via INTRAVENOUS
  Administered 2021-01-20: 120 ug via INTRAVENOUS

## 2021-01-20 MED ORDER — PROPOFOL 500 MG/50ML IV EMUL
INTRAVENOUS | Status: DC | PRN
  Administered 2021-01-20: 130 ug/kg/min via INTRAVENOUS

## 2021-01-20 MED ORDER — SODIUM CHLORIDE 0.9 % IV SOLN: INTRAVENOUS | Status: DC

## 2021-01-20 MED ORDER — EPHEDRINE SULFATE-NACL 50-0.9 MG/10ML-% IV SOSY
PREFILLED_SYRINGE | INTRAVENOUS | Status: DC | PRN
  Administered 2021-01-20 (×2): 10 mg via INTRAVENOUS

## 2021-01-20 MED ORDER — OMEPRAZOLE 20 MG PO CPDR: 40.0000 mg | DELAYED_RELEASE_CAPSULE | Freq: Two times a day (BID) | ORAL | 6 refills | Status: AC

## 2021-01-20 MED ORDER — CIPROFLOXACIN IN D5W 400 MG/200ML IV SOLN
INTRAVENOUS | Status: DC | PRN
  Administered 2021-01-20: 400 mg via INTRAVENOUS

## 2021-01-20 MED ORDER — CIPROFLOXACIN HCL 500 MG PO TABS: 500.0000 mg | ORAL_TABLET | Freq: Two times a day (BID) | ORAL | 0 refills | Status: AC

## 2021-01-20 SURGICAL SUPPLY — 14 items

## 2021-01-20 NOTE — Anesthesia Preprocedure Evaluation (Addendum)
Anesthesia Evaluation  Patient identified by MRN, date of birth, ID band Patient awake    Reviewed: Allergy & Precautions, NPO status , Patient's Chart, lab work & pertinent test results  History of Anesthesia Complications (+) PONV and Family history of anesthesia reactionNegative for: history of anesthetic complications  Airway Mallampati: II  TM Distance: >3 FB Neck ROM: Full    Dental  (+) Upper Dentures, Partial Lower   Pulmonary COPD, Current Smoker,    Pulmonary exam normal        Cardiovascular hypertension, Pt. on medications Normal cardiovascular exam     Neuro/Psych PSYCHIATRIC DISORDERS Depression negative neurological ROS     GI/Hepatic negative GI ROS, Neg liver ROS,   Endo/Other  diabetes, Oral Hypoglycemic Agents  Renal/GU Renal InsufficiencyRenal disease (Cr 1.10)  negative genitourinary   Musculoskeletal  (+) Arthritis , Osteoarthritis,    Abdominal   Peds  Hematology  (+) Blood dyscrasia, anemia ,   Anesthesia Other Findings  Current smoker/COPD, HTN, DM   Reproductive/Obstetrics                            Anesthesia Physical  Anesthesia Plan  ASA: 3  Anesthesia Plan: MAC   Post-op Pain Management:    Induction: Intravenous  PONV Risk Score and Plan: 3 and Ondansetron and Treatment may vary due to age or medical condition  Airway Management Planned: Nasal Cannula, Natural Airway, Simple Face Mask and Mask  Additional Equipment: None  Intra-op Plan:   Post-operative Plan:   Informed Consent: I have reviewed the patients History and Physical, chart, labs and discussed the procedure including the risks, benefits and alternatives for the proposed anesthesia with the patient or authorized representative who has indicated his/her understanding and acceptance.     Dental advisory given  Plan Discussed with: Anesthesiologist and CRNA  Anesthesia Plan  Comments:        Anesthesia Quick Evaluation

## 2021-01-20 NOTE — Transfer of Care (Signed)
Immediate Anesthesia Transfer of Care Note  Patient: [name] [name]  Procedure(s) Performed: UPPER ENDOSCOPIC ULTRASOUND (EUS) RADIAL ESOPHAGOGASTRODUODENOSCOPY (EGD) WITH PROPOFOL BIOPSY FINE NEEDLE ASPIRATION (FNA) LINEAR SAVORY DILATION  Patient Location: PACU and Endoscopy Unit  Anesthesia Type:MAC  Level of Consciousness: awake, alert  and oriented  Airway & Oxygen Therapy: Patient Spontanous Breathing and Patient connected to face mask  Post-op Assessment: Report given to RN and Post -op Vital signs reviewed and stable  Post vital signs: Reviewed and stable  Last Vitals:  Vitals Value Taken Time  BP 110/38 01/20/21 1056  Temp    Pulse 74 01/20/21 1059  Resp 19 01/20/21 1059  SpO2 100 % 01/20/21 1059  Vitals shown include unvalidated device data.  Last Pain:  Vitals:   01/20/21 1056  TempSrc:   PainSc: 0-No pain         Complications: No notable events documented.

## 2021-01-20 NOTE — Op Note (Signed)
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 Estimated Blood Loss:     Estimated blood loss was minimal. Procedure:                Pre-Anesthesia Assessment:                           - Prior to the procedure, a History and Physical                            was performed, and patient medications and                            allergies were reviewed. The patient's tolerance of                            previous anesthesia was also reviewed. The risks                            and benefits of the procedure and the sedation                            options and risks were discussed with the patient.                            All questions were answered, and informed consent                            was obtained. Prior Anticoagulants: The patient has  taken no previous anticoagulant or antiplatelet                            agents except for aspirin. ASA Grade Assessment:                            III - A patient with severe systemic disease. After                            reviewing the risks and benefits, the patient was                            deemed in satisfactory condition to  undergo the                            procedure.                           After obtaining informed consent, the endoscope was                            passed under direct vision. Throughout the                            procedure, the patient's blood pressure, pulse, and                            oxygen saturations were monitored continuously. The                            GIF-H190 ([id number]) Olympus endoscope was introduced                            through the mouth, and advanced to the second part                            of duodenum. After obtaining informed consent, the                            endoscope was passed under direct vision.                            Throughout the procedure, the patient's blood                            pressure, pulse, and oxygen saturations were                            monitored continuously. The GF-UE190-AL5 ([id number])                            Olympus radial ultrasound scope was introduced  through the mouth, and advanced to the stomach for                            ultrasound examination from the esophagus and                            stomach. The [id number] ([id number]) Olympus linear                            ultrasound scope was introduced through the mouth,                            and advanced to the esophagus for ultrasound                            examination. The upper EUS was somewhat difficult.                            Successful completion of the procedure was aided by                            performing the maneuvers documented (below) in this                            report. The patient tolerated the procedure. Scope In: Scope Out: Findings:      ENDOSCOPIC FINDING: :      A moderate-sized area of extrinsic compression was found in the proximal       esophagus at approximately 16-20 cm.      One benign-appearing, intrinsic moderate (circumferential scarring or       stenosis; an  endoscope may pass) stenosis was found 21 to 23 cm from the       incisors. This stenosis measured 1.2 cm (inner diameter). The stenosis       was traversed with the endoscope/radial echoendoscope/linear       echoendoscope.      Mucosal changes including feline appearance, small-caliber esophagus and       circumferential folds were found in the entire esophagus. Esophageal       findings were graded using the Eosinophilic Esophagitis Endoscopic       Reference Score (EoE-EREFS) as: Edema Grade 0 Normal (distinct vascular       markings), Rings Grade 1 Mild (subtle circumferential ridges seen on       esophageal distension), Exudates Grade 0 None (no white lesions seen),       Furrows Grade 1 Present (vertical lines with or without visible depth)       and Stricture present. Biopsies were taken with a cold forceps for       histology from the proximal/middle esophagus. Biopsies were taken with a       cold forceps for histology from the distal esophagus.      LA Grade B (one or more mucosal breaks greater than 5 mm, not extending       between the tops of two mucosal folds) esophagitis with no bleeding was       found at the gastroesophageal junction.      A non-obstructing Schatzki ring was found  at the gastroesophageal       junction. Biopsies were taken with a cold forceps for histology and to       disrupt the Schatzki ring.      A 4 cm hiatal hernia was present.      Diffuse moderate inflammation characterized by congestion (edema),       erosions, friability and granularity was found in the entire examined       stomach. Biopsies were taken with a cold forceps for histology and       Helicobacter pylori testing.      Patchy mild inflammation characterized by erythema and granularity was       found in the duodenal bulb, in the first portion of the duodenum and in       the second portion of the duodenum. Biopsies were taken with a cold       forceps for histology.      After the  completion of the rest of the procedure, a guidewire was       placed and the scope was withdrawn. Dilation was performed in the       esophagus with a Savary dilator with no resistance at 12.8 mm, 13 mm and       14 mm, mild resistance at 15 mm and moderate resistance at 16 mm. The       dilation site was examined following endoscope reinsertion and showed at       the proximal stricture mild mucosal disruption, mild improvement in       luminal narrowing and no perforation.      ENDOSONOGRAPHIC FINDING: :      A lobulated intramural (subepithelial) lesion was found in the cervical       esophagus. It was encountered at 17 cm from the incisors and extended to       20 cm. The lesion was heterogenous (areas of hypoechogenicity and       homogenity were present within this lesion). Sonographically, the origin       appeared to be within the muscularis propria (Layer 4) but I did lose       part of the adventitia (Layer 5). The lesion measured up to 35 mm in       thickness. The endosonographic borders were irregular. Fine needle       biopsy was performed . Color Doppler imaging was utilized prior to       needle puncture to confirm a lack of significant vascular structures       within the needle path. Four passes were made with the Acquire 25 gauge       ultrasound core biopsy needle using a transesophageal approach. A       visible core of tissue was obtained. Preliminary cytologic examination       and touch preps were performed. Final cytology results are pending.      There was no other sign of significant endosonographic abnormality in       the thoracic esophagus and in the gastroesophageal junction.      Endosonographic imaging in the visualized portion of the liver showed no       mass.      The celiac region was visualized. Impression:               EGD Impression:                           -  Extrinsic compression in the proximal esophagus.                           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 Biopsied                            proximal/middle and distal separately.                           - LA Grade B esophagitis with no bleeding at GE Jxn.                           - Non-obstructing Schatzki ring. Biopsied/disrupted.                           - Dilation performed in the esophagus with mucosal                            wrent at the proximal stricture.                           - 4 cm hiatal hernia.                           - Gastritis. Biopsied.                           - Duodenitis. Biopsied.                           EUS Impression:                           - An intramural (subepithelial) lesion was found in                            the cervical esophagus. It appeared to originate                            from within the muscularis propria (Layer 4) and I                            lost the adventitia (Layer 5). Cytology results are                            pending. However, the endosonographic appearance is                            suspicious for a stromal cell (smooth muscle)  neoplasm, less likely duplication cyst. Fine needle                            biopsy performed.                           - There was no other sign of significant pathology                            in the thoracic esophagus and in the                            gastroesophageal junction. Moderate Sedation:      Not Applicable - Patient had care per Anesthesia. Recommendation:           - The patient will be observed post-procedure,                            until all discharge criteria are met.                           - Discharge patient to home.                           - Patient has a contact number available for                            emergencies. The signs and  symptoms of potential                            delayed complications were discussed with the                            patient. Return to normal activities tomorrow.                            Written discharge instructions were provided to the                            patient.                           - Dilation diet as per protocol. Clear liquids x                            2-hours. Then full liquids x 2-hours. Then soft                            diet thereafter. Can advance diet slowly tomorrow.                           - Please use Cepacol or Halls Lozenges +/-                            Chloraseptic spray for next 72-96 hours to  aid in                            sore thoat should you experience this.                           - Observe patient's clinical course.                           - Await cytology results and await path results.                           - Increase to Omeprazole 40 mg twice daily for next                            few months.                           - Ciprofloxacin 500 mg twice daily x 5-days to                            decrease risk of post-interventional infectious                            complications.                           - Consider repeat EGD with dilation in 3-4 months                            pending patient's dysphagia symptoms.                           - The findings and recommendations were discussed                            with the patient.                           - The findings and recommendations were discussed                            with the patient's family. Procedure Code(s):        --- Professional ---                           [phone], Esophagogastroduodenoscopy, flexible,                            transoral; with transendoscopic ultrasound-guided                            intramural or transmural fine needle                            aspiration/biopsy(s), (includes endoscopic  ultrasound examination limited to the esophagus,                            stomach or duodenum, and adjacent structures)                           43248, Esophagogastroduodenoscopy, flexible,                            transoral; with insertion of guide wire followed by                            passage of dilator(s) through esophagus over guide                            wire Diagnosis Code(s):        --- Professional ---                           K22.2, Esophageal obstruction                           K22.8, Other specified diseases of esophagus                           K20.90, Esophagitis, unspecified without bleeding                           K44.9, Diaphragmatic hernia without obstruction or                            gangrene                           K29.70, Gastritis, unspecified, without bleeding                           K29.80, Duodenitis without bleeding                           R93.89, Abnormal findings on diagnostic imaging of                            other specified body structures                           R13.10, Dysphagia, unspecified                           R93.3, Abnormal findings on diagnostic imaging of                            other parts of digestive tract CPT copyright 2019 [employer]. All rights reserved. The codes documented in this report are preliminary and upon coder review may  be revised to meet current compliance requirements. Justice Britain, MD 01/20/2021 11:17:36 AM Number of Addenda: 0

## 2021-01-20 NOTE — Anesthesia Postprocedure Evaluation (Signed)
Anesthesia Post Note  Patient: Karen Chafe Stickley  Procedure(s) Performed: UPPER ENDOSCOPIC ULTRASOUND (EUS) RADIAL ESOPHAGOGASTRODUODENOSCOPY (EGD) WITH PROPOFOL BIOPSY FINE NEEDLE ASPIRATION (FNA) LINEAR SAVORY DILATION     Patient location during evaluation: PACU Anesthesia Type: MAC Level of consciousness: awake and alert Pain management: pain level controlled Vital Signs Assessment: post-procedure vital signs reviewed and stable Respiratory status: spontaneous breathing, nonlabored ventilation, respiratory function stable and patient connected to nasal cannula oxygen Cardiovascular status: stable and blood pressure returned to baseline Postop Assessment: no apparent nausea or vomiting Anesthetic complications: no   No notable events documented.  Last Vitals:  Vitals:   01/20/21 1120 01/20/21 1131  BP: 118/71 (!) 102/35  Pulse: 70 (!) 58  Resp: 18 17  Temp:    SpO2: 96% 93%    Last Pain:  Vitals:   01/20/21 1131  TempSrc:   PainSc: 0-No pain                 Analysa Nutting

## 2021-01-20 NOTE — Discharge Instructions (Signed)
YOU HAD AN ENDOSCOPIC PROCEDURE TODAY: Refer to the procedure report and other information in the discharge instructions given to you for any specific questions about what was found during the examination. If this information does not answer your questions, please call [place] office at [phone] to clarify.   YOU SHOULD EXPECT: Some feelings of bloating in the abdomen. Passage of more gas than usual. Walking can help get rid of the air that was put into your GI tract during the procedure and reduce the bloating. If you had a lower endoscopy (such as a colonoscopy or flexible sigmoidoscopy) you may notice spotting of blood in your stool or on the toilet paper. Some abdominal soreness may be present for a day or two, also.  DIET: Your first meal following the procedure should be a light meal and then it is ok to progress to your normal diet. A half-sandwich or bowl of soup is an example of a good first meal. Heavy or fried foods are harder to digest and may make you feel nauseous or bloated. Drink plenty of fluids but you should avoid alcoholic beverages for 24 hours. If you had a esophageal dilation, please see attached instructions for diet.    ACTIVITY: Your care partner should take you home directly after the procedure. You should plan to take it easy, moving slowly for the rest of the day. You can resume normal activity the day after the procedure however YOU SHOULD NOT DRIVE, use power tools, machinery or perform tasks that involve climbing or major physical exertion for 24 hours (because of the sedation medicines used during the test).   SYMPTOMS TO REPORT IMMEDIATELY: A gastroenterologist can be reached at any hour. Please call [phone]  for any of the following symptoms:   Following upper endoscopy (EGD, EUS, ERCP, esophageal dilation) Vomiting of blood or coffee ground material  New, significant abdominal pain  New, significant chest pain or pain under the shoulder blades  Painful or  persistently difficult swallowing  New shortness of breath  Black, tarry-looking or red, bloody stools  FOLLOW UP:  If any biopsies were taken you will be contacted by phone or by letter within the next 1-3 weeks. Call [phone]  if you have not heard about the biopsies in 3 weeks.  Please also call with any specific questions about appointments or follow up tests.  

## 2021-01-20 NOTE — H&P (Signed)
GASTROENTEROLOGY PROCEDURE H&P NOTE   Primary Care Physician: Marco Collie, MD  HPI: [name] [name] is a 75 y.o. 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  Past Medical History:  Diagnosis Date   Anemia 2016   Chronic kidney disease    Complication of anesthesia    Hard to wake up   COPD (chronic obstructive pulmonary disease) ([place])    Depression    Diabetes mellitus without complication (HCC)    Family history of adverse reaction to anesthesia    Father had post op N & V   Hyperlipidemia    Hypertension    Osteoarthritis    PONV (postoperative nausea and vomiting)    Past Surgical History:  Procedure Laterality Date   ADENOIDECTOMY     APPENDECTOMY     CHOLECYSTECTOMY     COLONOSCOPY  12/24/2016   Colonic polyp status post polypectomy. Mild sigmoid diverticulosis   EYE SURGERY Bilateral    implants for cataracts   LUMBAR LAMINECTOMY/ DECOMPRESSION WITH MET-RX Right 02/16/2020   Procedure: MIS MICRODISCECTOMY RIGHT LUMBAR THREE-FOUR;  Surgeon: Dawley, Theodoro Doing, DO;  Location: [facility];  Service: Neurosurgery;  Laterality: Right;  posterior   neuroplasty decompression     median nerve at carpal tunnel   TONSILLECTOMY     TUBAL LIGATION     No current facility-administered medications for this encounter.   No current facility-administered medications for this encounter. Allergies  Allergen Reactions   Amoxicillin-Pot Clavulanate Rash   Family History  Problem Relation Age of Onset   Colon cancer Neg Hx    Esophageal cancer Neg Hx    Pancreatic cancer Neg Hx    Liver cancer Neg Hx    Stomach cancer Neg Hx    Social History   Socioeconomic History   Marital status: Married    Spouse name: Not on file   Number of children: Not on file   Years of education: Not on file   Highest education level: Not on file  Occupational History   Not on file  Tobacco Use   Smoking  status: Every Day    Packs/day: 0.50    Years: 52.00    Pack years: 26.00    Types: Cigarettes   Smokeless tobacco: Never  Vaping Use   Vaping Use: Never used  Substance and Sexual Activity   Alcohol use: Not Currently   Drug use: Never   Sexual activity: Not Currently  Other Topics Concern   Not on file  Social History Narrative   Not on file   Social Determinants of Health   Financial Resource Strain: Not on file  Food Insecurity: Not on file  Transportation Needs: Not on file  Physical Activity: Not on file  Stress: Not on file  Social Connections: Not on file  Intimate Partner Violence: Not on file    Physical Exam: There were no vitals filed for this visit. There is no height or weight on file to calculate BMI. GEN: NAD EYE: Sclerae anicteric ENT: MMM CV: Non-tachycardic GI: Soft, NT/ND NEURO:  Alert & Oriented x 3  Lab Results: No results for input(s): WBC, HGB, HCT, PLT in the last 72 hours. BMET No results for input(s): NA, K, CL, CO2, GLUCOSE, BUN, CREATININE, CALCIUM in the last 72 hours. LFT No results for input(s): PROT, ALBUMIN, AST, ALT, ALKPHOS, BILITOT, BILIDIR, IBILI in the last 72 hours. PT/INR No results for  input(s): LABPROT, INR in the last 72 hours.   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  The risks and benefits of endoscopic evaluation/treatment were discussed with the patient and/or family; these include but are not limited to the risk of perforation, infection, bleeding, missed lesions, lack of diagnosis, severe illness requiring hospitalization, as well as anesthesia and sedation related illnesses.  The patient's history has been reviewed, patient examined, no change in status, and deemed stable for procedure.  The patient and/or family is agreeable to proceed.    Justice Britain, MD [place]  Gastroenterology Advanced Endoscopy Office # [phone]

## 2021-01-21 LAB — CYTOLOGY - NON PAP

## 2021-01-21 LAB — SURGICAL PATHOLOGY

## 2021-01-22 ENCOUNTER — Encounter: Payer: Self-pay | Admitting: Gastroenterology

## 2021-01-22 ENCOUNTER — Encounter (HOSPITAL_COMMUNITY): Payer: Self-pay | Admitting: Gastroenterology

## 2021-01-27 ENCOUNTER — Other Ambulatory Visit: Payer: Self-pay

## 2021-01-27 ENCOUNTER — Telehealth: Payer: Self-pay

## 2021-01-27 DIAGNOSIS — E079 Disorder of thyroid, unspecified: Secondary | ICD-10-CM

## 2021-01-27 NOTE — Telephone Encounter (Signed)
-----   Message from Jackquline Denmark, MD sent at 01/26/2021  3:01 PM EST ----- Regarding: RE: paraesophageal mass Dr Harlow Asa, Will take care of it. RG  Remo Lipps, Pl set her up for ultrasound neck (RE: thyroid mass) ASAP RG   ----- Message ----- From: Armandina Gemma, MD Sent: 01/26/2021   9:21 AM EST To: Mardella Layman., MD, # Subject: paraesophageal mass                            Chester Holstein,  I've reviewed the studies from August.  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  If so, we may be able to resect it with a cervical approach.  I would like an ultrasound exam of the thyroid and surrounding neck structures prior to seeing her in the office.  Also a TSH level.  If you could order those tests (or her primary MD), then I will ask my nurse to set her up for an office consult.  Thanks!  tmg  Armandina Gemma, [facility] A [place] practice Office: [phone]  ----- Message ----- From: Irving Copas., MD Sent: 01/22/2021   4:06 AM EST To: Armandina Gemma, MD, Jackquline Denmark, MD  Dr. Harlow Asa, I hope you are doing well. I was wondering if he had an opportunity to review this case of Dr. Steve Rattler. Woman who has had a lesion that has slowly grown over the course of years that was noted to be a paraesophageal versus esophageal mass on lung cancer CT screening. She has had progressive dysphagia symptoms as well. Saw some extrinsic impression and on EUS felt that this was a lesion that could be developing from later for or from the adventitia. My biopsies are showing epithelioid and colloid cells concerning for this to be a thyroid lesion. I was wondering if you thought further evaluation and work-up of this patient would be reasonable and since I know you have experience in this particular region, I was wondering what you would think about meeting  this patient and/or additional work-up that could be helpful for you before meeting this patient if you agree to that. I placed Dr. Lyndel Safe on here as well since he is her primary GI. Appreciate the time and experience. GM

## 2021-01-27 NOTE — Telephone Encounter (Signed)
Pt was scheduled for an US of the neck on 02/03/2021 @ [facility] @ 11:30. Pt to arrive at 11:15. Pt made aware. Pt verbalized understanding with all questions answered.

## 2021-02-03 ENCOUNTER — Ambulatory Visit (HOSPITAL_COMMUNITY)
Admission: RE | Admit: 2021-02-03 | Discharge: 2021-02-03 | Disposition: A | Discharge status: Home / Self Care | Payer: Medicare Other | Source: Ambulatory Visit | Attending: Gastroenterology | Admitting: Gastroenterology

## 2021-02-03 DIAGNOSIS — E079 Disorder of thyroid, unspecified: Secondary | ICD-10-CM | POA: Diagnosis not present

## 2021-02-03 DIAGNOSIS — E042 Nontoxic multinodular goiter: Secondary | ICD-10-CM | POA: Diagnosis not present

## 2021-02-03 DIAGNOSIS — Z23 Encounter for immunization: Secondary | ICD-10-CM | POA: Diagnosis not present

## 2021-02-04 ENCOUNTER — Other Ambulatory Visit: Payer: Self-pay

## 2021-02-04 ENCOUNTER — Telehealth: Payer: Self-pay

## 2021-02-04 ENCOUNTER — Other Ambulatory Visit (INDEPENDENT_AMBULATORY_CARE_PROVIDER_SITE_OTHER): Payer: Medicare Other

## 2021-02-04 DIAGNOSIS — E079 Disorder of thyroid, unspecified: Secondary | ICD-10-CM

## 2021-02-04 DIAGNOSIS — R1310 Dysphagia, unspecified: Secondary | ICD-10-CM

## 2021-02-04 LAB — COMPREHENSIVE METABOLIC PANEL
ALT: 21 U/L (ref 0–35)
AST: 20 U/L (ref 0–37)
Albumin: 4.5 g/dL (ref 3.5–5.2)
Alkaline Phosphatase: 47 U/L (ref 39–117)
BUN: 22 mg/dL (ref 6–23)
CO2: 27 mEq/L (ref 19–32)
Calcium: 10 mg/dL (ref 8.4–10.5)
Chloride: 103 mEq/L (ref 96–112)
Creatinine, Ser: 1.21 mg/dL — ABNORMAL HIGH (ref 0.40–1.20)
GFR: 44.56 mL/min — ABNORMAL LOW (ref >60.00)
Glucose, Bld: 108 mg/dL — ABNORMAL HIGH (ref 70–99)
Potassium: 4.4 mEq/L (ref 3.5–5.1)
Sodium: 137 mEq/L (ref 135–145)
Total Bilirubin: 1 mg/dL (ref 0.2–1.2)
Total Protein: 7.7 g/dL (ref 6.0–8.3)

## 2021-02-04 LAB — TSH: TSH: 0.93 u[IU]/mL (ref 0.35–5.50)

## 2021-02-04 LAB — CBC WITH DIFFERENTIAL/PLATELET
Basophils Absolute: 0.1 10*3/uL (ref 0.0–0.1)
Basophils Relative: 1.2 % (ref 0.0–3.0)
Eosinophils Absolute: 0.2 10*3/uL (ref 0.0–0.7)
Eosinophils Relative: 2.1 % (ref 0.0–5.0)
HCT: 42.6 % (ref 36.0–46.0)
Hemoglobin: 14.3 g/dL (ref 12.0–15.0)
Lymphocytes Relative: 21.9 % (ref 12.0–46.0)
Lymphs Abs: 1.8 10*3/uL (ref 0.7–4.0)
MCHC: 33.6 g/dL (ref 30.0–36.0)
MCV: 95.9 fl (ref 78.0–100.0)
Monocytes Absolute: 0.6 10*3/uL (ref 0.1–1.0)
Monocytes Relative: 7.1 % (ref 3.0–12.0)
Neutro Abs: 5.6 10*3/uL (ref 1.4–7.7)
Neutrophils Relative %: 67.7 % (ref 43.0–77.0)
Platelets: 180 10*3/uL (ref 150.0–400.0)
RBC: 4.44 Mil/uL (ref 3.87–5.11)
RDW: 12.2 % (ref 11.5–15.5)
WBC: 8.2 10*3/uL (ref 4.0–10.5)

## 2021-02-04 NOTE — Telephone Encounter (Signed)
Orders for labs placed in Epic. Pt made aware. Given address and location of Lab. Pt states that she will come by today and get the labs drawn. Pt verbalized understanding with all questions answered

## 2021-02-04 NOTE — Telephone Encounter (Signed)
-----   Message from Jackquline Denmark, MD sent at 02/03/2021  7:04 PM EST ----- Regarding: FW: paraesophageal mass Needs  CBC, CMP, thyroid function tests including TSH and serum thyroglobulin as soon as possible RE: suspected thyroid mass, dysphagia RG   ----- Message ----- From: Armandina Gemma, MD Sent: 01/26/2021   9:21 AM EST To: Mardella Layman., MD, # Subject: paraesophageal mass                            Chester Holstein,  I've reviewed the studies from August.  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  If so, we may be able to resect it with a cervical approach.  I would like an ultrasound exam of the thyroid and surrounding neck structures prior to seeing her in the office.  Also a TSH level.  If you could order those tests (or her primary MD), then I will ask my nurse to set her up for an office consult.  Thanks!  tmg  Armandina Gemma, [facility] [place] practice Office: [phone]  ----- Message ----- From: Irving Copas., MD Sent: 01/22/2021   4:06 AM EST To: Armandina Gemma, MD, Jackquline Denmark, MD  Dr. Harlow Asa, I hope you are doing well. I was wondering if he had an opportunity to review this case of Dr. Steve Rattler. Woman who has had a lesion that has slowly grown over the course of years that was noted to be a paraesophageal versus esophageal mass on lung cancer CT screening. She has had progressive dysphagia symptoms as well. Saw some extrinsic impression and on EUS felt that this was a lesion that could be developing from later for or from the adventitia. My biopsies are showing epithelioid and colloid cells concerning for this to be a thyroid lesion. I was wondering if you thought further evaluation and work-up of this patient would be reasonable and since I know you have experience in this particular region, I was wondering what you  would think about meeting this patient and/or additional work-up that could be helpful for you before meeting this patient if you agree to that. I placed Dr. Lyndel Safe on here as well since he is her primary GI. Appreciate the time and experience. GM

## 2021-02-05 ENCOUNTER — Other Ambulatory Visit: Payer: Self-pay

## 2021-02-05 DIAGNOSIS — E1122 Type 2 diabetes mellitus with diabetic chronic kidney disease: Secondary | ICD-10-CM | POA: Diagnosis not present

## 2021-02-05 DIAGNOSIS — R809 Proteinuria, unspecified: Secondary | ICD-10-CM | POA: Diagnosis not present

## 2021-02-05 DIAGNOSIS — E785 Hyperlipidemia, unspecified: Secondary | ICD-10-CM | POA: Diagnosis not present

## 2021-02-05 DIAGNOSIS — E079 Disorder of thyroid, unspecified: Secondary | ICD-10-CM

## 2021-02-05 DIAGNOSIS — I129 Hypertensive chronic kidney disease with stage 1 through stage 4 chronic kidney disease, or unspecified chronic kidney disease: Secondary | ICD-10-CM | POA: Diagnosis not present

## 2021-02-05 DIAGNOSIS — N183 Chronic kidney disease, stage 3 unspecified: Secondary | ICD-10-CM | POA: Diagnosis not present

## 2021-02-05 DIAGNOSIS — Z720 Tobacco use: Secondary | ICD-10-CM | POA: Diagnosis not present

## 2021-02-05 NOTE — Progress Notes (Signed)
The proposed treatment discussed in conference is for discussion purpose only and is not a binding recommendation.  The patients have not been physically examined, or presented with their treatment options.  Therefore, final treatment plans cannot be decided.  

## 2021-02-06 ENCOUNTER — Telehealth: Payer: Self-pay

## 2021-02-06 ENCOUNTER — Other Ambulatory Visit: Payer: Self-pay | Admitting: Gastroenterology

## 2021-02-06 DIAGNOSIS — J449 Chronic obstructive pulmonary disease, unspecified: Secondary | ICD-10-CM | POA: Diagnosis not present

## 2021-02-06 DIAGNOSIS — E079 Disorder of thyroid, unspecified: Secondary | ICD-10-CM

## 2021-02-06 DIAGNOSIS — E1169 Type 2 diabetes mellitus with other specified complication: Secondary | ICD-10-CM | POA: Diagnosis not present

## 2021-02-06 NOTE — Telephone Encounter (Signed)
NM Thyroid Mutt Uptake W/ imaging ordered and scheduled for Dec 12 @ 10:00 at [facility]. Pt to arrive at the radiology department at 9:30. This appointment will last for most of the day. Pt made aware The second part of that test is scheduled for Dec 13th at 10:00 Pt to arrive at 9:30 at the same location. Pt made aware  The [place] FNA Bx Thyroid ordered and has been previously scheduled for 03/13/2021 @ 4:30 PM @ [place] at [facility]. Pt stated that she was aware of the appointment date and time.   Pt verbalized understanding and all questions answered.

## 2021-02-06 NOTE — Telephone Encounter (Signed)
----- Message from Jackquline Denmark, MD sent at 02/05/2021  1:16 PM EST ----- Regarding: FW: paraesophageal mass Remo Lipps, Also pl order -Thyroid nuclear scan -Radiology referral for FNA biopsy of thyroid nodules  RG   ----- Message ----- From: Armandina Gemma, MD Sent: 02/05/2021  10:02 AM EST To: Jackquline Denmark, MD Subject: RE: paraesophageal mass                        Thanks for including me in the discussion this morning.  My office has contacted the patient and is waiting to schedule her appointment with me.  I agree that a thyroid nuclear scan would be valuable in confirming thyroid tissue in this mass behind the esophagus.  Also proceeding with the FNA biopsy as recommended by radiology will facilitate decision making for surgery.  I look forward to seeing her.  I'm hopeful that we can take care of this through a cervical approach.  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 Radiology recommends FNA biopsy of moderately suspicious 2 cm superior left and  2 cm inferior left nodules.  Merrie Roof  ----- Message ----- From: Armandina Gemma, MD Sent: 01/26/2021   9:21 AM EST To: Mardella Layman., MD, # Subject: paraesophageal mass                            Chester Holstein,  I've reviewed the studies from August.  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  If so, we may be able to resect it with a cervical approach.  I would like an ultrasound exam of the thyroid and surrounding neck structures prior to seeing her in the office.  Also a TSH level.  If you could order those tests  (or her primary MD), then I will ask my nurse to set her up for an office consult.  Thanks!  tmg  Armandina Gemma, [facility] practice Office: [phone]  ----- Message ----- From: Irving Copas., MD Sent: 01/22/2021   4:06 AM EST To: Armandina Gemma, MD, Jackquline Denmark, MD  Dr. Harlow Asa, I hope you are doing well. I was wondering if he had an opportunity to review this case of Dr. Steve Rattler. Woman who has had a lesion that has slowly grown over the course of years that was noted to be a paraesophageal versus esophageal mass on lung cancer CT screening. She has had progressive dysphagia symptoms as well. Saw some extrinsic impression and on EUS felt that this was a lesion that could be developing from later for or from the adventitia. My biopsies are showing epithelioid and colloid cells concerning for this to be a thyroid lesion. I was wondering if you thought further evaluation and work-up of this patient would be reasonable and since I know you have experience in this particular region, I was wondering what you would think about meeting this patient and/or additional work-up that could be helpful for  you before meeting this patient if you agree to that. I placed Dr. Lyndel Safe on here as well since he is her primary GI. Appreciate the time and experience. GM

## 2021-02-17 ENCOUNTER — Other Ambulatory Visit: Payer: Self-pay

## 2021-02-17 ENCOUNTER — Encounter (HOSPITAL_COMMUNITY)
Admission: RE | Admit: 2021-02-17 | Discharge: 2021-02-17 | Disposition: A | Discharge status: Home / Self Care | Payer: Medicare Other | Source: Ambulatory Visit | Attending: Gastroenterology | Admitting: Gastroenterology

## 2021-02-17 DIAGNOSIS — E079 Disorder of thyroid, unspecified: Secondary | ICD-10-CM | POA: Diagnosis not present

## 2021-02-17 MED ORDER — SODIUM IODIDE I 131 CAPSULE
11.3000 | Freq: Once | INTRAVENOUS | Status: AC | PRN
  Administered 2021-02-17: 11.3 via ORAL

## 2021-02-18 ENCOUNTER — Encounter (HOSPITAL_COMMUNITY)
Admission: RE | Admit: 2021-02-18 | Discharge: 2021-02-18 | Disposition: A | Discharge status: Home / Self Care | Payer: Medicare Other | Source: Ambulatory Visit | Attending: Gastroenterology | Admitting: Gastroenterology

## 2021-02-18 DIAGNOSIS — E042 Nontoxic multinodular goiter: Secondary | ICD-10-CM | POA: Diagnosis not present

## 2021-02-20 DIAGNOSIS — E049 Nontoxic goiter, unspecified: Secondary | ICD-10-CM | POA: Diagnosis not present

## 2021-02-20 DIAGNOSIS — E042 Nontoxic multinodular goiter: Secondary | ICD-10-CM | POA: Diagnosis not present

## 2021-02-25 ENCOUNTER — Ambulatory Visit
Admission: RE | Admit: 2021-02-25 | Discharge: 2021-02-25 | Disposition: A | Discharge status: Home / Self Care | Payer: Medicare Other | Source: Ambulatory Visit | Attending: Gastroenterology | Admitting: Gastroenterology

## 2021-02-25 ENCOUNTER — Other Ambulatory Visit (HOSPITAL_COMMUNITY)
Admission: RE | Admit: 2021-02-25 | Discharge: 2021-02-25 | Disposition: A | Discharge status: Home / Self Care | Payer: Medicare Other | Source: Ambulatory Visit | Attending: Interventional Radiology | Admitting: Interventional Radiology

## 2021-02-25 DIAGNOSIS — D34 Benign neoplasm of thyroid gland: Secondary | ICD-10-CM | POA: Insufficient documentation

## 2021-02-25 DIAGNOSIS — D440 Neoplasm of uncertain behavior of thyroid gland: Secondary | ICD-10-CM | POA: Insufficient documentation

## 2021-02-25 DIAGNOSIS — E079 Disorder of thyroid, unspecified: Secondary | ICD-10-CM

## 2021-02-25 DIAGNOSIS — E041 Nontoxic single thyroid nodule: Secondary | ICD-10-CM | POA: Diagnosis not present

## 2021-02-25 DIAGNOSIS — E042 Nontoxic multinodular goiter: Secondary | ICD-10-CM | POA: Diagnosis not present

## 2021-02-26 LAB — CYTOLOGY - NON PAP

## 2021-03-05 NOTE — Progress Notes (Signed)
Atypia on FNA biopsy.  Notify patient that [name] [name] will take 10-14 days and we will notify with results.  [place], MD [facility] [place] practice Office: [phone]

## 2021-03-09 DIAGNOSIS — J449 Chronic obstructive pulmonary disease, unspecified: Secondary | ICD-10-CM | POA: Diagnosis not present

## 2021-03-09 DIAGNOSIS — E1140 Type 2 diabetes mellitus with diabetic neuropathy, unspecified: Secondary | ICD-10-CM | POA: Diagnosis not present

## 2021-03-09 DIAGNOSIS — E1169 Type 2 diabetes mellitus with other specified complication: Secondary | ICD-10-CM | POA: Diagnosis not present

## 2021-03-09 DIAGNOSIS — I129 Hypertensive chronic kidney disease with stage 1 through stage 4 chronic kidney disease, or unspecified chronic kidney disease: Secondary | ICD-10-CM | POA: Diagnosis not present

## 2021-03-13 ENCOUNTER — Other Ambulatory Visit: Payer: Medicare Other

## 2021-03-18 ENCOUNTER — Encounter (HOSPITAL_COMMUNITY): Payer: Self-pay

## 2021-03-20 ENCOUNTER — Ambulatory Visit: Payer: Self-pay | Admitting: Surgery

## 2021-04-07 NOTE — Patient Instructions (Signed)
DUE TO COVID-19 ONLY ONE VISITOR IS ALLOWED TO COME WITH YOU AND STAY IN THE WAITING ROOM ONLY DURING PRE OP AND PROCEDURE.   **NO VISITORS ARE ALLOWED IN THE SHORT STAY AREA OR RECOVERY ROOM!!**  IF YOU WILL BE ADMITTED INTO THE HOSPITAL YOU ARE ALLOWED ONLY TWO SUPPORT PEOPLE DURING VISITATION HOURS ONLY (7 AM -8PM)   The support person(s) must pass our screening, gel in and out, and wear a mask at all times, including in the patients room. Patients must also wear a mask when staff or their support person are in the room. Visitors GUEST BADGE MUST BE WORN VISIBLY  One adult visitor may remain with you overnight and MUST be in the room by 8 P.M.  No visitors under the age of 76. Any visitor under the age of 67 must be accompanied by an adult.    COVID SWAB TESTING MUST BE COMPLETED ON: 04/18/21 @ 9:45 AM    Site: [facility] Enter: Main Entrance have a seat in the waiting area to the right of main entrance (DO NOT [place]!!!!!) Dial: [phone] to alert staff you have arrived  You are not required to quarantine, however you are required to wear a well-fitted mask when you are out and around people not in your household.  Hand Hygiene often Do NOT share personal items Notify your provider if you are in close contact with someone who has COVID or you develop fever 100.4 or greater, new onset of sneezing, cough, sore throat, shortness of breath or body aches.  [place], [address], must go inside of the hospital, NOT A DRIVE THRU!  (Must self quarantine after testing. Follow instructions on handout.)       Your procedure is scheduled on: 04/22/21   Report to [facility] Main Entrance    Report to admitting at : 7:45 AM   Call this number if you have problems the morning of surgery [phone]   Do not eat food :After Midnight.   May have liquids until  : 7:00 AM   day of surgery  CLEAR LIQUID DIET  Foods Allowed                                                                     Foods Excluded  Water, Black Coffee and tea, regular and decaf                             liquids that you cannot  Plain Jell-O in any flavor  (No red)                                           see through such as: Fruit ices (not with fruit pulp)                                     milk, soups, orange juice  Iced Popsicles (No red)                                    All solid food                                   Apple juices Sports drinks like Gatorade (No red) Lightly seasoned clear broth or consume(fat free) Sugar  Sample Menu Breakfast                                Lunch                                     Supper Cranberry juice                    Beef broth                            Chicken broth Jell-O                                     Grape juice                           Apple juice Coffee or tea                        Jell-O                                      Popsicle                                                Coffee or tea                        Coffee or tea    Oral Hygiene is also important to reduce your risk of infection.                                    Remember - BRUSH YOUR TEETH THE MORNING OF SURGERY WITH YOUR REGULAR TOOTHPASTE   Do NOT smoke after Midnight   Take these medicines the morning of surgery with A SIP OF WATER: omeprazole.Use inhalers as usual. How to Manage Your Diabetes Before and After Surgery  Why is it important to control my blood sugar before and after surgery? Improving blood sugar levels before and after surgery helps healing and can limit problems. A way of improving blood sugar control is eating a healthy diet by:  Eating less sugar and carbohydrates  Increasing activity/exercise  Talking with your doctor about reaching your blood sugar goals High blood sugars (greater than 180 mg/dL)  can raise your risk of infections and slow your recovery,  so you will need to focus on controlling your diabetes during the weeks before surgery. Make sure that the doctor who takes care of your diabetes knows about your planned surgery including the date and location.  How do I manage my blood sugar before surgery? Check your blood sugar at least 4 times a day, starting 2 days before surgery, to make sure that the level is not too high or low. Check your blood sugar the morning of your surgery when you wake up and every 2 hours until you get to the Short Stay unit. If your blood sugar is less than 70 mg/dL, you will need to treat for low blood sugar: Do not take insulin. Treat a low blood sugar (less than 70 mg/dL) with  cup of clear juice (cranberry or apple), 4 glucose tablets, OR glucose gel. Recheck blood sugar in 15 minutes after treatment (to make sure it is greater than 70 mg/dL). If your blood sugar is not greater than 70 mg/dL on recheck, call [phone] for further instructions. Report your blood sugar to the short stay nurse when you get to Short Stay.  If you are admitted to the hospital after surgery: Your blood sugar will be checked by the staff and you will probably be given insulin after surgery (instead of oral diabetes medicines) to make sure you have good blood sugar levels. The goal for blood sugar control after surgery is 80-180 mg/dL.   WHAT DO I DO ABOUT MY DIABETES MEDICATION?  Do not take oral diabetes medicines (pills) the morning of surgery.  THE DAY BEFORE SURGERY, DO NOT take jardiance.      THE MORNING OF SURGERY, DO NOT TAKE ANY ORAL DIABETIC MEDICATIONS DAY OF YOUR SURGERY                              You may not have any metal on your body including hair pins, jewelry, and body piercing             Do not wear make-up, lotions, powders, perfumes/cologne, or deodorant  Do not wear nail polish including gel and S&S, artificial/acrylic nails, or any  other type of covering on natural nails including finger and toenails. If you have artificial nails, gel coating, etc. that needs to be removed by a nail salon please have this removed prior to surgery or surgery may need to be canceled/ delayed if the surgeon/ anesthesia feels like they are unable to be safely monitored.   Do not shave  48 hours prior to surgery.    Do not bring valuables to the hospital. [place].   Contacts, dentures or bridgework may not be worn into surgery.   Bring small overnight bag day of surgery.    Patients discharged on the day of surgery will not be allowed to drive home.  Someone needs to stay with you for the first 24 hours after anesthesia.   Special Instructions: Bring a copy of your healthcare power of attorney and living will documents         the day of surgery if you haven't scanned them before.              Please read over the following fact sheets you were given: IF YOU HAVE [place] [phone]   - Preparing for Surgery Before surgery, you can play an important role.  Because skin is not sterile, your skin needs to be as free of germs as possible.  You can reduce the number of germs on your skin by washing with CHG (chlorahexidine gluconate) soap before surgery.  CHG is an antiseptic cleaner which kills germs and bonds with the skin to continue killing germs even after washing. Please DO NOT use if you have an allergy to CHG or antibacterial soaps.  If your skin becomes reddened/irritated stop using the CHG and inform your nurse when you arrive at Short Stay. Do not shave (including legs and underarms) for at least 48 hours prior to the first CHG shower.  You may shave your face/neck. Please follow these instructions carefully:  1.  Shower with CHG Soap the night before surgery and the  morning of Surgery.  2.  If you choose to wash your hair,  wash your hair first as usual with your  normal  shampoo.  3.  After you shampoo, rinse your hair and body thoroughly to remove the  shampoo.                           4.  Use CHG as you would any other liquid soap.  You can apply chg directly  to the skin and wash                       Gently with a scrungie or clean washcloth.  5.  Apply the CHG Soap to your body ONLY FROM THE NECK DOWN.   Do not use on face/ open                           Wound or open sores. Avoid contact with eyes, ears mouth and genitals (private parts).                       Wash face,  Genitals (private parts) with your normal soap.             6.  Wash thoroughly, paying special attention to the area where your surgery  will be performed.  7.  Thoroughly rinse your body with warm water from the neck down.  8.  DO NOT shower/wash with your normal soap after using and rinsing off  the CHG Soap.                9.  Pat yourself dry with a clean towel.            10.  Wear clean pajamas.            11.  Place clean sheets on your bed the night of your first shower and do not  sleep with pets. Day of Surgery : Do not apply any lotions/deodorants the morning of surgery.  Please wear clean clothes to the hospital/surgery center.  FAILURE TO FOLLOW THESE INSTRUCTIONS MAY RESULT IN THE CANCELLATION OF YOUR SURGERY PATIENT SIGNATURE_________________________________  NURSE SIGNATURE__________________________________  ________________________________________________________________________

## 2021-04-08 ENCOUNTER — Other Ambulatory Visit: Payer: Self-pay

## 2021-04-08 ENCOUNTER — Ambulatory Visit (HOSPITAL_COMMUNITY)
Admission: RE | Admit: 2021-04-08 | Discharge: 2021-04-08 | Disposition: A | Discharge status: Home / Self Care | Payer: Medicare Other | Source: Ambulatory Visit | Attending: Anesthesiology | Admitting: Anesthesiology

## 2021-04-08 ENCOUNTER — Encounter (HOSPITAL_COMMUNITY)
Admission: RE | Admit: 2021-04-08 | Discharge: 2021-04-08 | Disposition: A | Discharge status: Home / Self Care | Payer: Medicare Other | Source: Ambulatory Visit | Attending: Surgery | Admitting: Surgery

## 2021-04-08 ENCOUNTER — Encounter (HOSPITAL_COMMUNITY): Payer: Self-pay

## 2021-04-08 VITALS — BP 116/53 | HR 61 | Temp 98.2°F | Resp 18 | Ht 61.0 in | Wt 139.2 lb

## 2021-04-08 DIAGNOSIS — I2510 Atherosclerotic heart disease of native coronary artery without angina pectoris: Secondary | ICD-10-CM | POA: Insufficient documentation

## 2021-04-08 DIAGNOSIS — E119 Type 2 diabetes mellitus without complications: Secondary | ICD-10-CM

## 2021-04-08 DIAGNOSIS — E079 Disorder of thyroid, unspecified: Secondary | ICD-10-CM | POA: Diagnosis not present

## 2021-04-08 DIAGNOSIS — Z01818 Encounter for other preprocedural examination: Secondary | ICD-10-CM | POA: Diagnosis not present

## 2021-04-08 HISTORY — DX: Anxiety disorder, unspecified: F41.9

## 2021-04-08 LAB — HEMOGLOBIN A1C
Hgb A1c MFr Bld: 5.9 % — ABNORMAL HIGH (ref 4.8–5.6)
Mean Plasma Glucose: 122.63 mg/dL

## 2021-04-08 LAB — CBC
HCT: 47.7 % — ABNORMAL HIGH (ref 36.0–46.0)
Hemoglobin: 15.6 g/dL — ABNORMAL HIGH (ref 12.0–15.0)
MCH: 31.9 pg (ref 26.0–34.0)
MCHC: 32.7 g/dL (ref 30.0–36.0)
MCV: 97.5 fL (ref 80.0–100.0)
Platelets: 178 10*3/uL (ref 150–400)
RBC: 4.89 MIL/uL (ref 3.87–5.11)
RDW: 11.9 % (ref 11.5–15.5)
WBC: 8.4 10*3/uL (ref 4.0–10.5)
nRBC: 0 % (ref 0.0–0.2)

## 2021-04-08 LAB — BASIC METABOLIC PANEL
Anion gap: 7 (ref 5–15)
BUN: 25 mg/dL — ABNORMAL HIGH (ref 8–23)
CO2: 25 mmol/L (ref 22–32)
Calcium: 9.6 mg/dL (ref 8.9–10.3)
Chloride: 103 mmol/L (ref 98–111)
Creatinine, Ser: 0.97 mg/dL (ref 0.44–1.00)
GFR, Estimated: >60 mL/min (ref >60)
Glucose, Bld: 114 mg/dL — ABNORMAL HIGH (ref 70–99)
Potassium: 4.6 mmol/L (ref 3.5–5.1)
Sodium: 135 mmol/L (ref 135–145)

## 2021-04-08 LAB — GLUCOSE, CAPILLARY: Glucose-Capillary: 125 mg/dL — ABNORMAL HIGH (ref 70–99)

## 2021-04-08 NOTE — Progress Notes (Signed)
COVID Vaccine Completed: Yes Date COVID Vaccine completed: 02/03/21 x 4 COVID vaccine manufacturer: 3 -Pfizer    1-Moderna     COVID Test: 04/18/21 @ 9:45 AM PCP - Dr. Marco Collie. Cardiologist - N/A  Chest x-ray -  EKG -  Stress Test -  ECHO -  Cardiac Cath -  Pacemaker/ICD device last checked:  Sleep Study -  CPAP -   Fasting Blood Sugar - 100's Checks Blood Sugar ___1__ times a day  Blood Thinner Instructions: Dr. Harlow Asa Aspirin Instructions:Will be on hold starting: 04/11/21 Last Dose:  Anesthesia review: Hx: DIA,HTN,COPD,Smoker  Patient denies shortness of breath, fever, cough and chest pain at PAT appointment   Patient verbalized understanding of instructions that were given to them at the PAT appointment. Patient was also instructed that they will need to review over the PAT instructions again at home before surgery.

## 2021-04-09 DIAGNOSIS — I129 Hypertensive chronic kidney disease with stage 1 through stage 4 chronic kidney disease, or unspecified chronic kidney disease: Secondary | ICD-10-CM | POA: Diagnosis not present

## 2021-04-09 DIAGNOSIS — E1169 Type 2 diabetes mellitus with other specified complication: Secondary | ICD-10-CM | POA: Diagnosis not present

## 2021-04-09 DIAGNOSIS — J449 Chronic obstructive pulmonary disease, unspecified: Secondary | ICD-10-CM | POA: Diagnosis not present

## 2021-04-09 DIAGNOSIS — E1140 Type 2 diabetes mellitus with diabetic neuropathy, unspecified: Secondary | ICD-10-CM | POA: Diagnosis not present

## 2021-04-18 ENCOUNTER — Encounter (HOSPITAL_COMMUNITY)
Admission: RE | Admit: 2021-04-18 | Discharge: 2021-04-18 | Disposition: A | Discharge status: Home / Self Care | Payer: Medicare Other | Source: Ambulatory Visit | Attending: Surgery | Admitting: Surgery

## 2021-04-18 ENCOUNTER — Other Ambulatory Visit: Payer: Self-pay

## 2021-04-18 DIAGNOSIS — Z20822 Contact with and (suspected) exposure to covid-19: Secondary | ICD-10-CM | POA: Diagnosis not present

## 2021-04-18 DIAGNOSIS — Z01812 Encounter for preprocedural laboratory examination: Secondary | ICD-10-CM | POA: Insufficient documentation

## 2021-04-18 LAB — SARS CORONAVIRUS 2 (TAT 6-24 HRS): SARS Coronavirus 2: NEGATIVE

## 2021-04-20 ENCOUNTER — Encounter (HOSPITAL_COMMUNITY): Payer: Self-pay | Admitting: Surgery

## 2021-04-20 DIAGNOSIS — E042 Nontoxic multinodular goiter: Secondary | ICD-10-CM | POA: Diagnosis present

## 2021-04-20 DIAGNOSIS — E049 Nontoxic goiter, unspecified: Secondary | ICD-10-CM | POA: Diagnosis present

## 2021-04-20 NOTE — H&P (Signed)
REFERRING PHYSICIAN: Marco Collie, MD  PROVIDER: Machael Raine Charlotta Newton, MD  Chief Complaint: New Consultation (Multiple thyroid nodules, paraesophageal mass)   History of Present Illness:  Patient is referred by Dr. Marco Collie and Dr. 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 Patient had initially undergone a screening CT scan of the chest given her history of tobacco use. She was found to have a mass adjacent to the proximal esophagus. Patient has undergone additional studies since that time including an upper endoscopy at which time the paraesophageal mass underwent fine-needle aspiration biopsy. This demonstrated epithelial cells in a follicular like arrangement with central colloid-like material raising the possibility of a substernal thyroid lesion. Ultrasound of the thyroid performed on February 03, 2021 showed bilateral thyroid nodules. 3 of these nodules were felt to be moderately suspicious and fine-needle aspiration biopsy was recommended. This procedure is scheduled for February 25, 2021. Patient also underwent a nuclear medicine thyroid scan on February 17, 2021. This demonstrated a small cold nodule in the upper left pole of the thyroid and a warm nodule in the inferior portion of the left lobe of the thyroid. I have reviewed the images and it appears that the left thyroid lobe does extend inferiorly, likely into the posterior mediastinum and may be consistent with the paraesophageal mass seen on CT scan. Patient presents today with her sister for evaluation. Patient has had no prior history of thyroid disease. She has never been on thyroid medication. She has had no prior head or neck surgery. There is a family history of hypothyroidism in the patient's sister. There is no history of other endocrinopathy. Patient does have a history of dysphagia and was found at the time of  endoscopy to have a Schatzki's ring and underwent dilatation with symptomatic improvement.  Review of Systems: A complete review of systems was obtained from the patient. I have reviewed this information and discussed as appropriate with the patient. See HPI as well for other ROS.  Review of Systems  Constitutional: Negative.  HENT: Negative.  Eyes: Negative.  Respiratory: Negative.  Cardiovascular: Negative.  Gastrointestinal:  Dysphagia  Genitourinary: Negative.  Musculoskeletal: Negative.  Skin: Negative.  Neurological: Negative.  Endo/Heme/Allergies: Negative.  Psychiatric/Behavioral: Negative.    Medical History: Past Medical History:  Diagnosis Date   Anemia   Chronic kidney disease   COPD (chronic obstructive pulmonary disease) (CMS-HCC)   Diabetes mellitus without complication (CMS-HCC)   GERD (gastroesophageal reflux disease)   Hyperlipidemia   Hypertension   Patient Active Problem List  Diagnosis   Multiple thyroid nodules   Substernal thyroid goiter   Past Surgical History:  Procedure Laterality Date   LAPAROSCOPIC CHOLECYSTECTOMY 1999    Allergies  Allergen Reactions   Penicillins Rash   Current Outpatient Medications on File Prior to Visit  Medication Sig Dispense Refill   empagliflozin (JARDIANCE) 25 mg tablet Take by mouth daily   escitalopram oxalate (LEXAPRO) 10 MG tablet Take 1 tablet (10 mg total) by mouth once daily   omega-3 acid ethyl esters (LOVAZA) 1 gram capsule   rosuvastatin (CRESTOR) 20 MG tablet Take 1 tablet (20 mg total) by mouth once daily   telmisartan (MICARDIS) 40 MG tablet Take 1 tablet (40 mg total) by mouth once daily   aspirin 81 MG EC tablet Take 1 tablet (81 mg total) by mouth once daily   iron fum,ps-folic-Bcomp,C no.9 (INTEGRA PLUS) 125 mg iron- 1 mg Cap  Take by mouth daily   omeprazole (PRILOSEC) 20 MG DR capsule TAKE 2 CAPSULES BY MOUTH TWICE DAILY BEFORE A MEAL   No current facility-administered medications on file  prior to visit.   Family History  Problem Relation Age of Onset   Obesity Mother   High blood pressure (Hypertension) Mother   Hyperlipidemia (Elevated cholesterol) Mother   Diabetes Mother   Hyperlipidemia (Elevated cholesterol) Father   Obesity Father   High blood pressure (Hypertension) Father   Coronary Artery Disease (Blocked arteries around heart) Father   Diabetes Father   Obesity Sister   Hyperlipidemia (Elevated cholesterol) Sister   High blood pressure (Hypertension) Sister   Diabetes Sister    Social History   Tobacco Use  Smoking Status Every Day   Packs/day: 0.50   Types: Cigarettes  Smokeless Tobacco Never    Social History   Socioeconomic History   Marital status: Married  Tobacco Use   Smoking status: Every Day  Packs/day: 0.50  Types: Cigarettes   Smokeless tobacco: Never  Substance and Sexual Activity   Alcohol use: Never   Drug use: Never   Objective:   Vitals:  BP: 122/76  Pulse: 68  Temp: 36.3 C (97.3 F)  SpO2: 97%  Weight: 58.2 kg (128 lb 3.2 oz)  Height: 154.9 cm (5\' 1" )   Body mass index is 24.22 kg/m.  Physical Exam   GENERAL APPEARANCE Development: normal Nutritional status: normal Gross deformities: none  SKIN Rash, lesions, ulcers: none Induration, erythema: none Nodules: none palpable  EYES Conjunctiva and lids: normal Pupils: equal and reactive Iris: normal bilaterally  EARS, NOSE, MOUTH, THROAT External ears: no lesion or deformity External nose: no lesion or deformity Hearing: grossly normal Due to Covid-19 pandemic, patient is wearing a mask.  NECK Symmetric: yes Trachea: midline Thyroid: no dominant palpable nodules in the thyroid bed  CHEST Respiratory effort: normal Retraction or accessory muscle use: no Breath sounds: normal bilaterally Rales, rhonchi, wheeze: none  CARDIOVASCULAR Auscultation: regular rhythm, normal rate Murmurs: none Pulses: radial pulse 2+ palpable Lower extremity  edema: none  MUSCULOSKELETAL Station and gait: normal Digits and nails: no clubbing or cyanosis Muscle strength: grossly normal all extremities Range of motion: grossly normal all extremities Deformity: none  LYMPHATIC Cervical: none palpable Supraclavicular: none palpable  PSYCHIATRIC Oriented to person, place, and time: yes Mood and affect: normal for situation Judgment and insight: appropriate for situation  Assessment and Plan:   Multiple thyroid nodules  Substernal thyroid goiter   Patient presents today for evaluation of newly diagnosed multiple thyroid nodules and potential substernal thyroid goiter representing a paraesophageal mass.  Patient provided with a copy of "The Thyroid Book: Medical and Surgical Treatment of Thyroid Problems", published by [employer], 16 pages. Book reviewed and explained to patient during visit today.  Today we reviewed her ultrasound report, her thyroid nuclear scan report, and her final report from gastroenterology after her upper endoscopy and biopsies. Patient is scheduled to undergo fine-needle aspiration biopsies of newly diagnosed thyroid nodules on December 20.  Today we discussed the possibility of proceeding with total thyroidectomy to remove both the right and left lobes of the thyroid gland and the possible substernal extension into the paraesophageal space. We discussed the size and location of the surgical incision, the hospitalization to be anticipated, and her postoperative recovery. We discussed potential complications such as recurrent laryngeal nerve injury and injury to parathyroid glands. We discussed the need for lifelong thyroid hormone replacement. Patient and her sister understand  and agree to proceed with ultrasound-guided fine-needle aspiration biopsies next week.  We will await the results of her cytopathology. I will contact the patient with those results and we will make a final decision at that time regarding thyroid  surgery. Patient agrees with this plan.  Armandina Gemma, MD [facility] [place] [facility] Office: [phone]

## 2021-04-22 ENCOUNTER — Ambulatory Visit (HOSPITAL_COMMUNITY): Payer: Medicare Other | Admitting: Anesthesiology

## 2021-04-22 ENCOUNTER — Ambulatory Visit (HOSPITAL_COMMUNITY)
Admission: RE | Admit: 2021-04-22 | Discharge: 2021-04-23 | Disposition: A | Discharge status: Home / Self Care | Payer: Medicare Other | Source: Ambulatory Visit | Attending: Surgery | Admitting: Surgery

## 2021-04-22 ENCOUNTER — Ambulatory Visit (HOSPITAL_BASED_OUTPATIENT_CLINIC_OR_DEPARTMENT_OTHER): Payer: Medicare Other | Admitting: Anesthesiology

## 2021-04-22 ENCOUNTER — Encounter (HOSPITAL_COMMUNITY): Payer: Self-pay | Admitting: Surgery

## 2021-04-22 ENCOUNTER — Encounter (HOSPITAL_COMMUNITY)
Admission: RE | Disposition: A | Discharge status: Home / Self Care | Payer: Self-pay | Source: Ambulatory Visit | Attending: Surgery

## 2021-04-22 DIAGNOSIS — E119 Type 2 diabetes mellitus without complications: Secondary | ICD-10-CM | POA: Diagnosis not present

## 2021-04-22 DIAGNOSIS — K222 Esophageal obstruction: Secondary | ICD-10-CM | POA: Diagnosis not present

## 2021-04-22 DIAGNOSIS — I10 Essential (primary) hypertension: Secondary | ICD-10-CM | POA: Insufficient documentation

## 2021-04-22 DIAGNOSIS — K2289 Other specified disease of esophagus: Secondary | ICD-10-CM | POA: Diagnosis not present

## 2021-04-22 DIAGNOSIS — J449 Chronic obstructive pulmonary disease, unspecified: Secondary | ICD-10-CM | POA: Diagnosis not present

## 2021-04-22 DIAGNOSIS — E049 Nontoxic goiter, unspecified: Secondary | ICD-10-CM

## 2021-04-22 DIAGNOSIS — E042 Nontoxic multinodular goiter: Secondary | ICD-10-CM

## 2021-04-22 DIAGNOSIS — F17210 Nicotine dependence, cigarettes, uncomplicated: Secondary | ICD-10-CM | POA: Diagnosis not present

## 2021-04-22 DIAGNOSIS — R222 Localized swelling, mass and lump, trunk: Secondary | ICD-10-CM | POA: Insufficient documentation

## 2021-04-22 DIAGNOSIS — Z01812 Encounter for preprocedural laboratory examination: Secondary | ICD-10-CM

## 2021-04-22 DIAGNOSIS — Z01818 Encounter for other preprocedural examination: Secondary | ICD-10-CM

## 2021-04-22 HISTORY — PX: THYROIDECTOMY: SHX17

## 2021-04-22 LAB — TYPE AND SCREEN
ABO/RH(D): A POS
Antibody Screen: NEGATIVE

## 2021-04-22 LAB — GLUCOSE, CAPILLARY
Glucose-Capillary: 118 mg/dL — ABNORMAL HIGH (ref 70–99)
Glucose-Capillary: 116 mg/dL — ABNORMAL HIGH (ref 70–99)
Glucose-Capillary: 161 mg/dL — ABNORMAL HIGH (ref 70–99)
Glucose-Capillary: 207 mg/dL — ABNORMAL HIGH (ref 70–99)

## 2021-04-22 LAB — ABO/RH: ABO/RH(D): A POS

## 2021-04-22 SURGERY — THYROIDECTOMY
Anesthesia: General

## 2021-04-22 MED ORDER — CALCIUM CARBONATE 1250 (500 CA) MG PO TABS
2.0000 | ORAL_TABLET | Freq: Three times a day (TID) | ORAL | Status: DC
  Administered 2021-04-22 – 2021-04-23 (×2): 1000 mg via ORAL
  Filled 2021-04-22 (×3): qty 1

## 2021-04-22 MED ORDER — IBUPROFEN 400 MG PO TABS: 600.0000 mg | ORAL_TABLET | Freq: Four times a day (QID) | ORAL | Status: DC | PRN

## 2021-04-22 MED ORDER — ACETAMINOPHEN 10 MG/ML IV SOLN: 1000.0000 mg | Freq: Once | INTRAVENOUS | Status: DC | PRN

## 2021-04-22 MED ORDER — ONDANSETRON HCL 4 MG/2ML IJ SOLN
INTRAMUSCULAR | Status: DC | PRN
  Administered 2021-04-22: 4 mg via INTRAVENOUS

## 2021-04-22 MED ORDER — CIPROFLOXACIN IN D5W 400 MG/200ML IV SOLN
400.0000 mg | INTRAVENOUS | Status: AC
  Administered 2021-04-22: 400 mg via INTRAVENOUS
  Filled 2021-04-22: qty 200

## 2021-04-22 MED ORDER — LACTATED RINGERS IV SOLN: INTRAVENOUS | Status: DC | PRN

## 2021-04-22 MED ORDER — IRBESARTAN 150 MG PO TABS
150.0000 mg | ORAL_TABLET | Freq: Every day | ORAL | Status: DC
  Administered 2021-04-22 – 2021-04-23 (×2): 150 mg via ORAL
  Filled 2021-04-22 (×2): qty 1

## 2021-04-22 MED ORDER — CHLORHEXIDINE GLUCONATE CLOTH 2 % EX PADS: 6.0000 | MEDICATED_PAD | Freq: Once | CUTANEOUS | Status: DC

## 2021-04-22 MED ORDER — APREPITANT 40 MG PO CAPS
ORAL_CAPSULE | ORAL | Status: AC
  Filled 2021-04-22: qty 1

## 2021-04-22 MED ORDER — MELATONIN 5 MG PO TABS
10.0000 mg | ORAL_TABLET | Freq: Every evening | ORAL | Status: DC | PRN
  Filled 2021-04-22: qty 2

## 2021-04-22 MED ORDER — HEMOSTATIC AGENTS (NO CHARGE) OPTIME
TOPICAL | Status: DC | PRN
  Administered 2021-04-22: 1 via TOPICAL

## 2021-04-22 MED ORDER — LABETALOL HCL 5 MG/ML IV SOLN
INTRAVENOUS | Status: AC
  Filled 2021-04-22: qty 4

## 2021-04-22 MED ORDER — INSULIN ASPART 100 UNIT/ML IJ SOLN
0.0000 [IU] | Freq: Three times a day (TID) | INTRAMUSCULAR | Status: DC
  Administered 2021-04-22: 3 [IU] via SUBCUTANEOUS

## 2021-04-22 MED ORDER — ONDANSETRON HCL 4 MG/2ML IJ SOLN: 4.0000 mg | Freq: Four times a day (QID) | INTRAMUSCULAR | Status: DC | PRN

## 2021-04-22 MED ORDER — ORAL CARE MOUTH RINSE: 15.0000 mL | Freq: Once | OROMUCOSAL | Status: AC

## 2021-04-22 MED ORDER — PROPOFOL 10 MG/ML IV BOLUS
INTRAVENOUS | Status: DC | PRN
Start: 2021-04-22 — End: 2021-04-22
  Administered 2021-04-22: 120 mg via INTRAVENOUS

## 2021-04-22 MED ORDER — FENTANYL CITRATE (PF) 250 MCG/5ML IJ SOLN
INTRAMUSCULAR | Status: AC
  Filled 2021-04-22: qty 5

## 2021-04-22 MED ORDER — HYDROMORPHONE HCL 1 MG/ML IJ SOLN: 1.0000 mg | INTRAMUSCULAR | Status: DC | PRN

## 2021-04-22 MED ORDER — ONDANSETRON 4 MG PO TBDP: 4.0000 mg | ORAL_TABLET | Freq: Four times a day (QID) | ORAL | Status: DC | PRN

## 2021-04-22 MED ORDER — ACETAMINOPHEN 650 MG RE SUPP: 650.0000 mg | Freq: Four times a day (QID) | RECTAL | Status: DC | PRN

## 2021-04-22 MED ORDER — PHENYLEPHRINE HCL (PRESSORS) 10 MG/ML IV SOLN
INTRAVENOUS | Status: AC
  Filled 2021-04-22: qty 1

## 2021-04-22 MED ORDER — SODIUM CHLORIDE 0.45 % IV SOLN: INTRAVENOUS | Status: DC

## 2021-04-22 MED ORDER — TRAMADOL HCL 50 MG PO TABS: 50.0000 mg | ORAL_TABLET | Freq: Four times a day (QID) | ORAL | Status: DC | PRN

## 2021-04-22 MED ORDER — ESCITALOPRAM OXALATE 20 MG PO TABS
10.0000 mg | ORAL_TABLET | Freq: Every day | ORAL | Status: DC
Start: 2021-04-22 — End: 2021-04-23
  Administered 2021-04-22: 10 mg via ORAL
  Filled 2021-04-22: qty 1

## 2021-04-22 MED ORDER — SUGAMMADEX SODIUM 200 MG/2ML IV SOLN
INTRAVENOUS | Status: DC | PRN
  Administered 2021-04-22: 140 mg via INTRAVENOUS

## 2021-04-22 MED ORDER — EPHEDRINE SULFATE-NACL 50-0.9 MG/10ML-% IV SOSY
PREFILLED_SYRINGE | INTRAVENOUS | Status: DC | PRN
  Administered 2021-04-22: 10 mg via INTRAVENOUS

## 2021-04-22 MED ORDER — PANTOPRAZOLE SODIUM 40 MG PO TBEC
40.0000 mg | DELAYED_RELEASE_TABLET | Freq: Every day | ORAL | Status: DC
  Administered 2021-04-23: 40 mg via ORAL
  Filled 2021-04-22: qty 1

## 2021-04-22 MED ORDER — PROPOFOL 10 MG/ML IV BOLUS
INTRAVENOUS | Status: AC
  Filled 2021-04-22: qty 20

## 2021-04-22 MED ORDER — OMEGA-3-ACID ETHYL ESTERS 1 G PO CAPS
2.0000 g | ORAL_CAPSULE | Freq: Two times a day (BID) | ORAL | Status: DC
  Administered 2021-04-22: 2 g via ORAL
  Filled 2021-04-22 (×2): qty 2

## 2021-04-22 MED ORDER — LIDOCAINE HCL (PF) 2 % IJ SOLN
INTRAMUSCULAR | Status: AC
  Filled 2021-04-22: qty 5

## 2021-04-22 MED ORDER — HYDROMORPHONE HCL 1 MG/ML IJ SOLN: 0.2500 mg | INTRAMUSCULAR | Status: DC | PRN

## 2021-04-22 MED ORDER — OXYCODONE HCL 5 MG PO TABS
5.0000 mg | ORAL_TABLET | Freq: Once | ORAL | Status: AC | PRN
  Administered 2021-04-22: 5 mg via ORAL

## 2021-04-22 MED ORDER — CHLORHEXIDINE GLUCONATE 0.12 % MT SOLN
15.0000 mL | Freq: Once | OROMUCOSAL | Status: AC
  Administered 2021-04-22: 15 mL via OROMUCOSAL

## 2021-04-22 MED ORDER — DROPERIDOL 2.5 MG/ML IJ SOLN
INTRAMUSCULAR | Status: DC | PRN
  Administered 2021-04-22: .625 mg via INTRAVENOUS

## 2021-04-22 MED ORDER — ROCURONIUM BROMIDE 10 MG/ML (PF) SYRINGE
PREFILLED_SYRINGE | INTRAVENOUS | Status: AC
  Filled 2021-04-22: qty 10

## 2021-04-22 MED ORDER — OXYCODONE HCL 5 MG PO TABS
ORAL_TABLET | ORAL | Status: AC
  Filled 2021-04-22: qty 1

## 2021-04-22 MED ORDER — PHENYLEPHRINE HCL-NACL 20-0.9 MG/250ML-% IV SOLN
INTRAVENOUS | Status: DC | PRN
  Administered 2021-04-22: 25 ug/min via INTRAVENOUS

## 2021-04-22 MED ORDER — TIOTROPIUM BROMIDE MONOHYDRATE 2.5 MCG/ACT IN AERS: 1.0000 | INHALATION_SPRAY | Freq: Every day | RESPIRATORY_TRACT | Status: DC

## 2021-04-22 MED ORDER — OXYCODONE HCL 5 MG/5ML PO SOLN: 5.0000 mg | Freq: Once | ORAL | Status: AC | PRN

## 2021-04-22 MED ORDER — ROCURONIUM BROMIDE 10 MG/ML (PF) SYRINGE
PREFILLED_SYRINGE | INTRAVENOUS | Status: DC | PRN
  Administered 2021-04-22: 60 mg via INTRAVENOUS

## 2021-04-22 MED ORDER — DEXAMETHASONE SODIUM PHOSPHATE 10 MG/ML IJ SOLN
INTRAMUSCULAR | Status: DC | PRN
Start: 2021-04-22 — End: 2021-04-22
  Administered 2021-04-22: 6 mg via INTRAVENOUS

## 2021-04-22 MED ORDER — ONDANSETRON HCL 4 MG/2ML IJ SOLN
INTRAMUSCULAR | Status: AC
  Filled 2021-04-22: qty 2

## 2021-04-22 MED ORDER — LIDOCAINE HCL 4 % MT SOLN
OROMUCOSAL | Status: DC | PRN
  Administered 2021-04-22: 5 mL via TOPICAL

## 2021-04-22 MED ORDER — LACTATED RINGERS IV SOLN: INTRAVENOUS | Status: DC

## 2021-04-22 MED ORDER — EMPAGLIFLOZIN 25 MG PO TABS
25.0000 mg | ORAL_TABLET | Freq: Every day | ORAL | Status: DC
  Administered 2021-04-22 – 2021-04-23 (×2): 25 mg via ORAL
  Filled 2021-04-22 (×2): qty 1

## 2021-04-22 MED ORDER — OXYCODONE HCL 5 MG PO TABS: 5.0000 mg | ORAL_TABLET | ORAL | Status: DC | PRN

## 2021-04-22 MED ORDER — DEXAMETHASONE SODIUM PHOSPHATE 10 MG/ML IJ SOLN
INTRAMUSCULAR | Status: AC
  Filled 2021-04-22: qty 1

## 2021-04-22 MED ORDER — UMECLIDINIUM BROMIDE 62.5 MCG/ACT IN AEPB
1.0000 | INHALATION_SPRAY | Freq: Every day | RESPIRATORY_TRACT | Status: DC
  Administered 2021-04-23: 1 via RESPIRATORY_TRACT
  Filled 2021-04-22: qty 7

## 2021-04-22 MED ORDER — DROPERIDOL 2.5 MG/ML IJ SOLN
INTRAMUSCULAR | Status: AC
  Filled 2021-04-22: qty 2

## 2021-04-22 MED ORDER — FENTANYL CITRATE (PF) 250 MCG/5ML IJ SOLN
INTRAMUSCULAR | Status: DC | PRN
  Administered 2021-04-22: 50 ug via INTRAVENOUS
  Administered 2021-04-22: 100 ug via INTRAVENOUS

## 2021-04-22 MED ORDER — 0.9 % SODIUM CHLORIDE (POUR BTL) OPTIME
TOPICAL | Status: DC | PRN
  Administered 2021-04-22: 100 mL

## 2021-04-22 MED ORDER — LIDOCAINE 2% (20 MG/ML) 5 ML SYRINGE
INTRAMUSCULAR | Status: DC | PRN
  Administered 2021-04-22: 60 mg via INTRAVENOUS

## 2021-04-22 MED ORDER — ONDANSETRON HCL 4 MG/2ML IJ SOLN: 4.0000 mg | Freq: Once | INTRAMUSCULAR | Status: DC | PRN

## 2021-04-22 MED ORDER — ACETAMINOPHEN 325 MG PO TABS
650.0000 mg | ORAL_TABLET | Freq: Four times a day (QID) | ORAL | Status: DC | PRN
  Administered 2021-04-23: 650 mg via ORAL
  Filled 2021-04-22: qty 2

## 2021-04-22 SURGICAL SUPPLY — 31 items
ATTRACTOMAT 16X20 MAGNETIC DRP (DRAPES) ×2 IMPLANT
BAG COUNTER SPONGE SURGICOUNT (BAG) ×2 IMPLANT
BLADE SURG 15 STRL LF DISP TIS (BLADE) ×1 IMPLANT
BLADE SURG 15 STRL SS (BLADE) ×1
CHLORAPREP W/TINT 26 (MISCELLANEOUS) ×2 IMPLANT
CLIP TI MEDIUM 6 (CLIP) ×4 IMPLANT
CLIP TI WIDE RED SMALL 6 (CLIP) ×6 IMPLANT
COVER SURGICAL LIGHT HANDLE (MISCELLANEOUS) ×2 IMPLANT
DERMABOND ADVANCED (GAUZE/BANDAGES/DRESSINGS) ×1
DERMABOND ADVANCED .7 DNX12 (GAUZE/BANDAGES/DRESSINGS) ×1 IMPLANT
DRAPE LAPAROTOMY T 98X78 PEDS (DRAPES) ×2 IMPLANT
DRAPE UTILITY XL STRL (DRAPES) ×2 IMPLANT
ELECT PENCIL ROCKER SW 15FT (MISCELLANEOUS) ×2 IMPLANT
ELECT REM PT RETURN 15FT ADLT (MISCELLANEOUS) ×2 IMPLANT
GAUZE 4X4 16PLY ~~LOC~~+RFID DBL (SPONGE) ×2 IMPLANT
GLOVE SURG SYN 7.5  E (GLOVE) ×2
GLOVE SURG SYN 7.5 E (GLOVE) ×2 IMPLANT
GLOVE SURG SYN 7.5 PF PI (GLOVE) ×2 IMPLANT
GOWN STRL REUS W/TWL XL LVL3 (GOWN DISPOSABLE) ×5 IMPLANT
HEMOSTAT SURGICEL 2X4 FIBR (HEMOSTASIS) ×2 IMPLANT
ILLUMINATOR WAVEGUIDE N/F (MISCELLANEOUS) ×2 IMPLANT
KIT BASIN OR (CUSTOM PROCEDURE TRAY) ×2 IMPLANT
KIT TURNOVER KIT A (KITS) ×1 IMPLANT
PACK BASIC VI WITH GOWN DISP (CUSTOM PROCEDURE TRAY) ×2 IMPLANT
SHEARS HARMONIC 9CM CVD (BLADE) ×2 IMPLANT
SUT MNCRL AB 4-0 PS2 18 (SUTURE) ×2 IMPLANT
SUT VIC AB 3-0 SH 18 (SUTURE) ×3 IMPLANT
SYR BULB IRRIG 60ML STRL (SYRINGE) ×2 IMPLANT
TOWEL OR 17X26 10 PK STRL BLUE (TOWEL DISPOSABLE) ×2 IMPLANT
TOWEL OR NON WOVEN STRL DISP B (DISPOSABLE) ×2 IMPLANT
TUBING CONNECTING 10 (TUBING) ×2 IMPLANT

## 2021-04-22 NOTE — Anesthesia Preprocedure Evaluation (Signed)
Anesthesia Evaluation  Patient identified by MRN, date of birth, ID band Patient awake    Reviewed: Allergy & Precautions, NPO status , Patient's Chart, lab work & pertinent test results  History of Anesthesia Complications (+) PONV and history of anesthetic complications  Airway Mallampati: II  TM Distance: >3 FB Neck ROM: Full    Dental no notable dental hx.    Pulmonary COPD, Current Smoker,    Pulmonary exam normal breath sounds clear to auscultation       Cardiovascular hypertension, Normal cardiovascular exam Rhythm:Regular Rate:Normal     Neuro/Psych negative neurological ROS  negative psych ROS   GI/Hepatic negative GI ROS, Neg liver ROS,   Endo/Other  diabetes  Renal/GU negative Renal ROS  negative genitourinary   Musculoskeletal negative musculoskeletal ROS (+)   Abdominal   Peds negative pediatric ROS (+)  Hematology negative hematology ROS (+)   Anesthesia Other Findings   Reproductive/Obstetrics negative OB ROS                             Anesthesia Physical Anesthesia Plan  ASA: 3  Anesthesia Plan: General   Post-op Pain Management: Dilaudid IV   Induction: Intravenous  PONV Risk Score and Plan: 3 and Ondansetron, Dexamethasone and Treatment may vary due to age or medical condition  Airway Management Planned: Oral ETT  Additional Equipment:   Intra-op Plan:   Post-operative Plan: Extubation in OR  Informed Consent: I have reviewed the patients History and Physical, chart, labs and discussed the procedure including the risks, benefits and alternatives for the proposed anesthesia with the patient or authorized representative who has indicated his/her understanding and acceptance.     Dental advisory given  Plan Discussed with: CRNA and Surgeon  Anesthesia Plan Comments:         Anesthesia Quick Evaluation

## 2021-04-22 NOTE — Transfer of Care (Signed)
Immediate Anesthesia Transfer of Care Note  Patient: [name] [name]  Procedure(s) Performed: TOTAL THYROIDECTOMY WITH RETRO-ESOPHAGEAL  MASS  Patient Location: PACU  Anesthesia Type:General  Level of Consciousness: drowsy  Airway & Oxygen Therapy: Patient Spontanous Breathing and Patient connected to face mask oxygen  Post-op Assessment: Report given to RN and Post -op Vital signs reviewed and stable  Post vital signs: Reviewed and stable  Last Vitals:  Vitals Value Taken Time  BP 143/72 04/22/21 1218  Temp    Pulse 69 04/22/21 1219  Resp 16 04/22/21 1219  SpO2 100 % 04/22/21 1219  Vitals shown include unvalidated device data.  Last Pain:  Vitals:   04/22/21 0825  TempSrc:   PainSc: 0-No pain         Complications: No notable events documented.

## 2021-04-22 NOTE — Anesthesia Procedure Notes (Addendum)
Procedure Name: Intubation Date/Time: 04/22/2021 10:20 AM Performed by: Sharlette Dense, CRNA Pre-anesthesia Checklist: Patient identified, Emergency Drugs available, Suction available and Patient being monitored Patient Re-evaluated:Patient Re-evaluated prior to induction Oxygen Delivery Method: Circle system utilized Preoxygenation: Pre-oxygenation with 100% oxygen Induction Type: IV induction Ventilation: Mask ventilation without difficulty and Oral airway inserted - appropriate to patient size Laryngoscope Size: Sabra Heck and 2 Grade View: Grade I Tube type: Oral Tube size: 7.0 mm Number of attempts: 1 Airway Equipment and Method: Stylet and LTA kit utilized Placement Confirmation: ETT inserted through vocal cords under direct vision, positive ETCO2 and breath sounds checked- equal and bilateral Secured at: 21 cm Tube secured with: Tape Dental Injury: Teeth and Oropharynx as per pre-operative assessment

## 2021-04-22 NOTE — Op Note (Signed)
Procedure Note  Pre-operative Diagnosis:  multinodular thyroid with substernal goiter  Post-operative Diagnosis:  same  Surgeon:  Armandina Gemma, MD  Assistant:  Candida Peeling, MD ([facility] Resident)   Procedure:  Total thyroidectomy with resection of retro-esophageal mass  Anesthesia:  General  Estimated Blood Loss:  minimal  Drains: none         Specimen: thyroid to pathology  Indications:  Patient is referred by Dr. Marco Collie and Dr. 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 Patient had initially undergone a screening CT scan of the chest given her history of tobacco use. She was found to have a mass adjacent to the proximal esophagus. Patient has undergone additional studies since that time including an upper endoscopy at which time the paraesophageal mass underwent fine-needle aspiration biopsy. This demonstrated epithelial cells in a follicular like arrangement with central colloid-like material raising the possibility of a substernal thyroid lesion. Ultrasound of the thyroid performed on February 03, 2021 showed bilateral thyroid nodules. 3 of these nodules were felt to be moderately suspicious and fine-needle aspiration biopsy was recommended. This procedure is scheduled for February 25, 2021. Patient also underwent a nuclear medicine thyroid scan on February 17, 2021. This demonstrated a small cold nodule in the upper left pole of the thyroid and a warm nodule in the inferior portion of the left lobe of the thyroid. I have reviewed the images and it appears that the left thyroid lobe does extend inferiorly, likely into the posterior mediastinum and may be consistent with the paraesophageal mass seen on CT scan. Patient presents today with her sister for evaluation. Patient has had no prior history of thyroid disease. She has never been on thyroid medication. She has had no prior head  or neck surgery. There is a family history of hypothyroidism in the patient's sister. There is no history of other endocrinopathy. Patient does have a history of dysphagia and was found at the time of endoscopy to have a Schatzki's ring and underwent dilatation with symptomatic improvement.  I was personally present during the key and critical portions of this procedure and immediately available throughout the entire procedure, as documented in my operative note.   Procedure Details: Procedure was done in OR #1 at the [facility]. The patient was brought to the operating room and placed in a supine position on the operating room table. Following administration of general anesthesia, the patient was positioned and then prepped and draped in the usual aseptic fashion. After ascertaining that an adequate level of anesthesia had been achieved, a small Kocher incision was made with #15 blade. Dissection was carried through subcutaneous tissues and platysma.Hemostasis was achieved with the electrocautery. Skin flaps were elevated cephalad and caudad from the thyroid notch to the sternal notch. A Mahorner self-retaining retractor was placed for exposure. Strap muscles were incised in the midline and dissection was begun on the left side.  Strap muscles were reflected laterally.  Left thyroid lobe was mildly enlarged and nodular.  The left lobe was gently mobilized with blunt dissection. Superior pole vessels were dissected out and divided individually between small and medium ligaclips with the harmonic scalpel. The thyroid lobe was rolled anteriorly. Branches of the inferior thyroid artery were divided between small ligaclips with the harmonic scalpel. Inferior venous tributaries were divided between ligaclips. Both the superior and inferior parathyroid glands were identified and preserved on their vascular pedicles. The recurrent laryngeal nerve was identified  and preserved along its course. The ligament of  Gwenlyn Found was released with the electrocautery and the gland was mobilized onto the anterior trachea. Isthmus was mobilized across the midline. There was an elongated thin pyramidal lobe present which was resected with the isthmus.  Further exploration on the left side revealed a mass posterior to the esophagus and extending inferiorly into the posterior mediastinum.  This did not appear to originate from the left thyroid lobe.  It was gently mobilized but with palpation shifted to the right of midline.  A decision was made to continue dissection on the right side for total thyroidectomy and evaluate this mass from the right side of the esophagus.  The mass was situated between the posterior aspect of the esophagus and the precervical fascia.  The right thyroid lobe was gently mobilized with blunt dissection. Right thyroid lobe was small in size with multiple nodules. Superior pole vessels were dissected out and divided between small and medium ligaclips with the Harmonic scalpel.  The right lobe was rolled anteriorly.  There was an extension of the right lobe going laterally and posteriorly connecting to the mass which was in the retroesophageal space.  The recurrent nerve was identified and passed medial to this mass and posterior to the remainder of the right thyroid lobe.  The nerve was preserved along its course.  Dissection was carried posteriorly and laterally into the precervical space.  The mass was gently dissected out and delivered through the right side of the neck.  This appears to be goiterous thyroid.  It was rotated anteriorly.  Vascular structures were dissected out and divided between small ligaclips with the harmonic scalpel.  Right superior parathyroid gland was dissected out and maintained on its vascular pedicle.  Recurrent nerve was preserved.  Inferior parathyroid was also dissected out and maintained on its vascular pedicle.  Inferior venous tributaries were divided between medium  ligaclips with the harmonic scalpel.  Branches of the inferior thyroid artery were divided between small ligaclips and the ligament of Gwenlyn Found was divided with the electrocautery.  The gland was rolled up on onto the anterior trachea from which it was completely excised with the harmonic scalpel.  The thyroid gland was marked with a single suture in the left thyroid lobe and a double suture in the thyroid goiter which had been located in the retroesophageal space.  The entire specimen is submitted to pathology for review.  The retroesophageal space is then palpated and no further residual tissue is identified.  The neck was irrigated with warm saline. Fibrillar was placed throughout the operative field. Strap muscles were approximated in the midline with interrupted 3-0 Vicryl sutures. Platysma was closed with interrupted 3-0 Vicryl sutures. Skin was closed with a running 4-0 Monocryl subcuticular suture. Wound was washed and Dermabond was applied. The patient was awakened from anesthesia and brought to the recovery room. The patient tolerated the procedure well.   Armandina Gemma, MD [facility] Office: [phone]

## 2021-04-22 NOTE — Interval H&P Note (Signed)
History and Physical Interval Note:  04/22/2021 9:50 AM  [name] [name]  has presented today for surgery, with the diagnosis of MULTINODULAR THYROID GOITER WITH SUBSTERNAL MASS.  The various methods of treatment have been discussed with the patient and family. After consideration of risks, benefits and other options for treatment, the patient has consented to    Procedure(s): TOTAL THYROIDECTOMY WITH RESECTION SUBSTERNAL MASS (N/A) as a surgical intervention.    The patient's history has been reviewed, patient examined, no change in status, stable for surgery.  I have reviewed the patient's chart and labs.  Questions were answered to the patient's satisfaction.    Armandina Gemma, [place] [facility] [place] practice Office: [place]

## 2021-04-22 NOTE — Anesthesia Postprocedure Evaluation (Signed)
Anesthesia Post Note  Patient: [name] [name]  Procedure(s) Performed: TOTAL THYROIDECTOMY WITH RETRO-ESOPHAGEAL  MASS     Patient location during evaluation: PACU Anesthesia Type: General Level of consciousness: awake and alert Pain management: pain level controlled Vital Signs Assessment: post-procedure vital signs reviewed and stable Respiratory status: spontaneous breathing, nonlabored ventilation, respiratory function stable and patient connected to nasal cannula oxygen Cardiovascular status: blood pressure returned to baseline and stable Postop Assessment: no apparent nausea or vomiting Anesthetic complications: no   No notable events documented.  Last Vitals:  Vitals:   04/22/21 1245 04/22/21 1300  BP: (!) 135/56 129/70  Pulse: (!) 59 71  Resp: 15 17  Temp:  36.9 C  SpO2: 96% 94%    Last Pain:  Vitals:   04/22/21 1256  TempSrc:   PainSc: 3                  Kimblery Diop S

## 2021-04-23 ENCOUNTER — Encounter (HOSPITAL_COMMUNITY): Payer: Self-pay | Admitting: Surgery

## 2021-04-23 ENCOUNTER — Other Ambulatory Visit: Payer: Self-pay

## 2021-04-23 DIAGNOSIS — K222 Esophageal obstruction: Secondary | ICD-10-CM | POA: Diagnosis not present

## 2021-04-23 DIAGNOSIS — I10 Essential (primary) hypertension: Secondary | ICD-10-CM | POA: Diagnosis not present

## 2021-04-23 DIAGNOSIS — F17210 Nicotine dependence, cigarettes, uncomplicated: Secondary | ICD-10-CM | POA: Diagnosis not present

## 2021-04-23 DIAGNOSIS — E042 Nontoxic multinodular goiter: Secondary | ICD-10-CM | POA: Diagnosis not present

## 2021-04-23 DIAGNOSIS — E119 Type 2 diabetes mellitus without complications: Secondary | ICD-10-CM | POA: Diagnosis not present

## 2021-04-23 DIAGNOSIS — R222 Localized swelling, mass and lump, trunk: Secondary | ICD-10-CM | POA: Diagnosis not present

## 2021-04-23 LAB — BASIC METABOLIC PANEL
Anion gap: 8 (ref 5–15)
BUN: 18 mg/dL (ref 8–23)
CO2: 24 mmol/L (ref 22–32)
Calcium: 8.7 mg/dL — ABNORMAL LOW (ref 8.9–10.3)
Chloride: 105 mmol/L (ref 98–111)
Creatinine, Ser: 0.86 mg/dL (ref 0.44–1.00)
GFR, Estimated: >60 mL/min (ref >60)
Glucose, Bld: 117 mg/dL — ABNORMAL HIGH (ref 70–99)
Potassium: 4.2 mmol/L (ref 3.5–5.1)
Sodium: 137 mmol/L (ref 135–145)

## 2021-04-23 LAB — GLUCOSE, CAPILLARY: Glucose-Capillary: 111 mg/dL — ABNORMAL HIGH (ref 70–99)

## 2021-04-23 MED ORDER — CALCIUM CARBONATE ANTACID 500 MG PO CHEW: 2.0000 | CHEWABLE_TABLET | Freq: Three times a day (TID) | ORAL | 1 refills | Status: AC

## 2021-04-23 MED ORDER — TRAMADOL HCL 50 MG PO TABS: 50.0000 mg | ORAL_TABLET | Freq: Four times a day (QID) | ORAL | 0 refills | Status: AC | PRN

## 2021-04-23 MED ORDER — LEVOTHYROXINE SODIUM 88 MCG PO TABS: 88.0000 ug | ORAL_TABLET | Freq: Every day | ORAL | 3 refills | Status: AC

## 2021-04-23 NOTE — Discharge Instructions (Signed)
[facility] - Dr. Kallen Delatorre  THYROID & PARATHYROID SURGERY:  POST-OP INSTRUCTIONS  Always review the instruction sheet provided by the hospital nurse at discharge.  A prescription for pain medication may be sent to your pharmacy at the time of discharge.  Take your pain medication as prescribed.  If narcotic pain medicine is not needed, then you may take acetaminophen (Tylenol) or ibuprofen (Advil) as needed for pain or soreness.  Take your normal home medications as prescribed unless otherwise directed.  If you need a refill on your pain medication, please contact the office during regular business hours.  Prescriptions will not be processed by the office after 5:00PM or on weekends.  Start with a light diet upon arrival home, such as soup and crackers or toast.  Be sure to drink plenty of fluids.  Resume your normal diet the day after surgery.  Most patients will experience some swelling and bruising on the chest and neck area.  Ice packs will help for the first 48 hours after arriving home.  Swelling and bruising will take several days to resolve.   It is common to experience some constipation after surgery.  Increasing fluid intake and taking a stool softener (Colace) will usually help to prevent this problem.  A mild laxative (Milk of Magnesia or Miralax) should be taken according to package directions if there has been no bowel movement after 48 hours.  Dermabond glue covers your incision. This seals the wound and you may shower at any time. The Dermabond will remain in place for about a week.  You may gradually remove the glue when it loosens around the edges.  If you need to loosen the Dermabond for removal, apply a layer of Vaseline to the wound for 15 minutes and then remove with a Kleenex. Your sutures are under the skin and will not show - they will dissolve on their own.  You may resume light daily activities beginning the day after discharge (such as self-care,  walking, climbing stairs), gradually increasing activities as tolerated. You may have sexual intercourse when it is comfortable. Refrain from any heavy lifting or straining until approved by your doctor. You may drive when you no longer are taking prescription pain medication, you can comfortably wear a seatbelt, and you can safely maneuver your car and apply the brakes.  You will see your doctor in the office for a follow-up appointment approximately three weeks after your surgery.  Make sure that you call for this appointment within a day or two after you arrive home to insure a convenient appointment time. Please have any requested laboratory tests performed a few days prior to your office visit so that the results will be available at your follow up appointment.  WHEN TO CALL THE [facility] OFFICE: -- Fever greater than 101.5 -- Inability to urinate -- Nausea and/or vomiting - persistent -- Extreme swelling or bruising -- Continued bleeding from incision -- Increased pain, redness, or drainage from the incision -- Difficulty swallowing or breathing -- Muscle cramping or spasms -- Numbness or tingling in hands or around lips  The clinic staff is available to answer your questions during regular business hours.  Please don't hesitate to call and ask to speak to one of the nurses if you have concerns.  [facility] OFFICE: [phone] (24 hours)  Please sign up for MyChart accounts. This will allow you to communicate directly with my nurse or myself without having to call the office. It will also allow you   to view your test results. You will need to enroll in MyChart for my office ([facility]) and for the hospital ([place]).  Chenika Nevils, MD [facility] 

## 2021-04-23 NOTE — Progress Notes (Signed)
Transition of Care [facility]) Screening Note  Patient Details  Name: [name] [name] Date of Birth: 12-24-1945  Transition of Care [facility]) CM/SW Contact:    Sherie Don, LCSW Phone Number: 04/23/2021, 10:41 AM  Transition of Care Department [facility]) has reviewed patient and no TOC needs have been identified at this time. We will continue to monitor patient advancement through interdisciplinary progression rounds. If new patient transition needs arise, please place a TOC consult.

## 2021-04-23 NOTE — Plan of Care (Signed)
Instructions were reviewed with patient. All questions were answered. Patient was transported to main entrance by wheelchair. ° °

## 2021-04-23 NOTE — Discharge Summary (Signed)
Physician Discharge Summary   Patient ID: [name] [name] MRN: [id number] DOB/AGE: [birth year] 76 y.o.  Admit date: 04/22/2021  Discharge date: 04/23/2021  Discharge Diagnoses:  Principal Problem:   Substernal thyroid goiter Active Problems:   Multiple thyroid nodules   Discharged Condition: good  Hospital Course: Patient was admitted for observation following thyroid surgery.  Post op course was uncomplicated.  Pain was well controlled.  Tolerated diet.  Post op calcium level on morning following surgery was 8.7 mg/dl.  Patient was prepared for discharge home on POD#1.  Consults: None  Treatments: surgery: total thyroidectomy with resection of substernal goiter  Discharge Exam: Blood pressure (!) 105/46, pulse (!) 54, temperature 98 F (36.7 C), resp. rate 18, height 5\' 1"  (1.549 m), weight 63 kg, SpO2 94 %. HEENT - clear Neck - wound dry and intact; mild STS, ecchymosis; voice with slight hoarseness, no stridor  Disposition: Home  Discharge Instructions     Diet - low sodium heart healthy   Complete by: As directed    Increase activity slowly   Complete by: As directed    No dressing needed   Complete by: As directed       Allergies as of 04/23/2021       Reactions   Amoxicillin-pot Clavulanate Rash   Penicillins Rash        Medication List     TAKE these medications    aspirin 81 MG chewable tablet Chew 81 mg by mouth at bedtime.   B-12 5000 MCG Subl Place 5,000 mcg under the tongue daily.   calcium carbonate 500 MG chewable tablet Commonly known as: Tums Chew 2 tablets (400 mg of elemental calcium total) by mouth 3 (three) times daily.   empagliflozin 25 MG Tabs tablet Commonly known as: JARDIANCE Take 25 mg by mouth daily.   escitalopram 10 MG tablet Commonly known as: LEXAPRO Take 10 mg by mouth at bedtime.   Integra Plus Caps Take 1 capsule by mouth daily.   levothyroxine 88 MCG tablet Commonly known as: Synthroid Take 1 tablet (88  mcg total) by mouth daily before breakfast.   Melatonin 10 MG Tabs Take 10 mg by mouth at bedtime as needed (sleep).   omega-3 acid ethyl esters 1 g capsule Commonly known as: LOVAZA Take 2 g by mouth 2 (two) times daily.   omeprazole 20 MG capsule Commonly known as: PRILOSEC Take 2 capsules (40 mg total) by mouth 2 (two) times daily before a meal. What changed: when to take this   rosuvastatin 20 MG tablet Commonly known as: CRESTOR Take 20 mg by mouth at bedtime.   Spiriva Respimat 2.5 MCG/ACT Aers Generic drug: Tiotropium Bromide Monohydrate Inhale 1 puff into the lungs daily.   telmisartan 40 MG tablet Commonly known as: MICARDIS Take 40 mg by mouth daily.   traMADol 50 MG tablet Commonly known as: ULTRAM Take 1-2 tablets (50-100 mg total) by mouth every 6 (six) hours as needed for moderate pain.   Vitamin D 125 MCG (5000 UT) Caps Take 5,000 Units by mouth daily.               Discharge Care Instructions  (From admission, onward)           Start     Ordered   04/23/21 0000  No dressing needed        04/23/21 0810            Follow-up Information     [facility] [place],  Sherren Mocha, MD. Schedule an appointment as soon as possible for a visit in 3 week(s).   Specialty: General Surgery Why: For wound re-check Contact information: [place] [address] [phone]                 Paulino Cork, [facility] Office: [phone]   Signed: Armandina Gemma 04/23/2021, 8:11 AM

## 2021-04-24 LAB — SURGICAL PATHOLOGY

## 2021-04-30 NOTE — Progress Notes (Signed)
Again, I do not know how to repond.  Use of incentive spirometry after general anesthesia has always been standard practice in this surgical community. If there is an issue, then I will remove it from my standard post op orders.  Armandina Gemma, MD [facility] [place] practice Office: [phone]

## 2021-05-01 NOTE — Progress Notes (Signed)
Final pathology all benign.  Good news!  [place] [place] [facility] Office: [phone]

## 2021-05-07 DIAGNOSIS — I129 Hypertensive chronic kidney disease with stage 1 through stage 4 chronic kidney disease, or unspecified chronic kidney disease: Secondary | ICD-10-CM | POA: Diagnosis not present

## 2021-05-07 DIAGNOSIS — J449 Chronic obstructive pulmonary disease, unspecified: Secondary | ICD-10-CM | POA: Diagnosis not present

## 2021-05-07 DIAGNOSIS — E1169 Type 2 diabetes mellitus with other specified complication: Secondary | ICD-10-CM | POA: Diagnosis not present

## 2021-05-07 DIAGNOSIS — E1140 Type 2 diabetes mellitus with diabetic neuropathy, unspecified: Secondary | ICD-10-CM | POA: Diagnosis not present

## 2021-05-13 DIAGNOSIS — E1169 Type 2 diabetes mellitus with other specified complication: Secondary | ICD-10-CM | POA: Diagnosis not present

## 2021-05-14 DIAGNOSIS — E042 Nontoxic multinodular goiter: Secondary | ICD-10-CM | POA: Diagnosis not present

## 2021-05-22 DIAGNOSIS — E1169 Type 2 diabetes mellitus with other specified complication: Secondary | ICD-10-CM | POA: Diagnosis not present

## 2021-05-22 DIAGNOSIS — N183 Chronic kidney disease, stage 3 unspecified: Secondary | ICD-10-CM | POA: Diagnosis not present

## 2021-05-22 DIAGNOSIS — I129 Hypertensive chronic kidney disease with stage 1 through stage 4 chronic kidney disease, or unspecified chronic kidney disease: Secondary | ICD-10-CM | POA: Diagnosis not present

## 2021-05-22 DIAGNOSIS — E782 Mixed hyperlipidemia: Secondary | ICD-10-CM | POA: Diagnosis not present

## 2021-06-05 DIAGNOSIS — E890 Postprocedural hypothyroidism: Secondary | ICD-10-CM | POA: Diagnosis not present

## 2021-06-07 DIAGNOSIS — I129 Hypertensive chronic kidney disease with stage 1 through stage 4 chronic kidney disease, or unspecified chronic kidney disease: Secondary | ICD-10-CM | POA: Diagnosis not present

## 2021-06-07 DIAGNOSIS — E1169 Type 2 diabetes mellitus with other specified complication: Secondary | ICD-10-CM | POA: Diagnosis not present

## 2021-06-12 DIAGNOSIS — E890 Postprocedural hypothyroidism: Secondary | ICD-10-CM | POA: Diagnosis not present

## 2021-06-12 DIAGNOSIS — N183 Chronic kidney disease, stage 3 unspecified: Secondary | ICD-10-CM | POA: Diagnosis not present

## 2021-06-12 DIAGNOSIS — I129 Hypertensive chronic kidney disease with stage 1 through stage 4 chronic kidney disease, or unspecified chronic kidney disease: Secondary | ICD-10-CM | POA: Diagnosis not present

## 2021-06-12 DIAGNOSIS — Z6826 Body mass index (BMI) 26.0-26.9, adult: Secondary | ICD-10-CM | POA: Diagnosis not present

## 2021-07-07 DIAGNOSIS — I129 Hypertensive chronic kidney disease with stage 1 through stage 4 chronic kidney disease, or unspecified chronic kidney disease: Secondary | ICD-10-CM | POA: Diagnosis not present

## 2021-07-07 DIAGNOSIS — E1169 Type 2 diabetes mellitus with other specified complication: Secondary | ICD-10-CM | POA: Diagnosis not present

## 2021-07-15 DIAGNOSIS — E890 Postprocedural hypothyroidism: Secondary | ICD-10-CM | POA: Diagnosis not present

## 2021-08-07 DIAGNOSIS — E1169 Type 2 diabetes mellitus with other specified complication: Secondary | ICD-10-CM | POA: Diagnosis not present

## 2021-08-07 DIAGNOSIS — I129 Hypertensive chronic kidney disease with stage 1 through stage 4 chronic kidney disease, or unspecified chronic kidney disease: Secondary | ICD-10-CM | POA: Diagnosis not present

## 2021-09-02 DIAGNOSIS — E119 Type 2 diabetes mellitus without complications: Secondary | ICD-10-CM | POA: Diagnosis not present

## 2021-09-02 DIAGNOSIS — Z961 Presence of intraocular lens: Secondary | ICD-10-CM | POA: Diagnosis not present

## 2021-09-03 DIAGNOSIS — E890 Postprocedural hypothyroidism: Secondary | ICD-10-CM | POA: Diagnosis not present

## 2021-09-04 IMAGING — MG MM DIGITAL SCREENING BILAT W/ TOMO AND CAD
8 series · 8 of 24 positions shown · non-contrast
Comparison: Previous exam(s).

CLINICAL DATA: Screening.

EXAM:
DIGITAL SCREENING BILATERAL MAMMOGRAM WITH TOMOSYNTHESIS AND CAD
TECHNIQUE: Bilateral screening digital craniocaudal and mediolateral oblique
mammograms were obtained. Bilateral screening digital breast
tomosynthesis was performed. The images were evaluated with
computer-aided detection.

[R CC synth-2D]
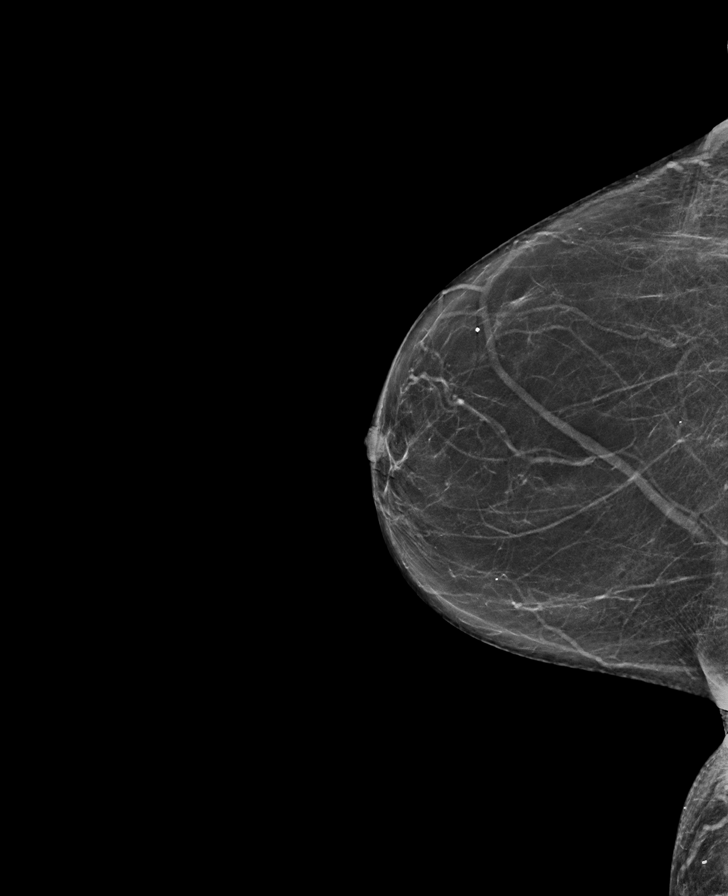

[R MLO synth-2D]
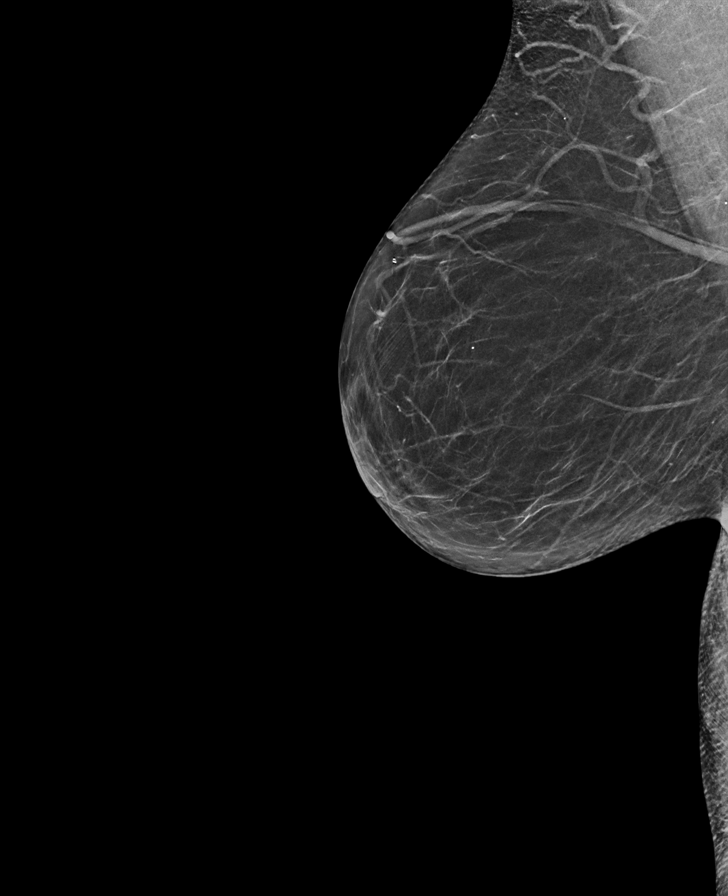

[L CC synth-2D]
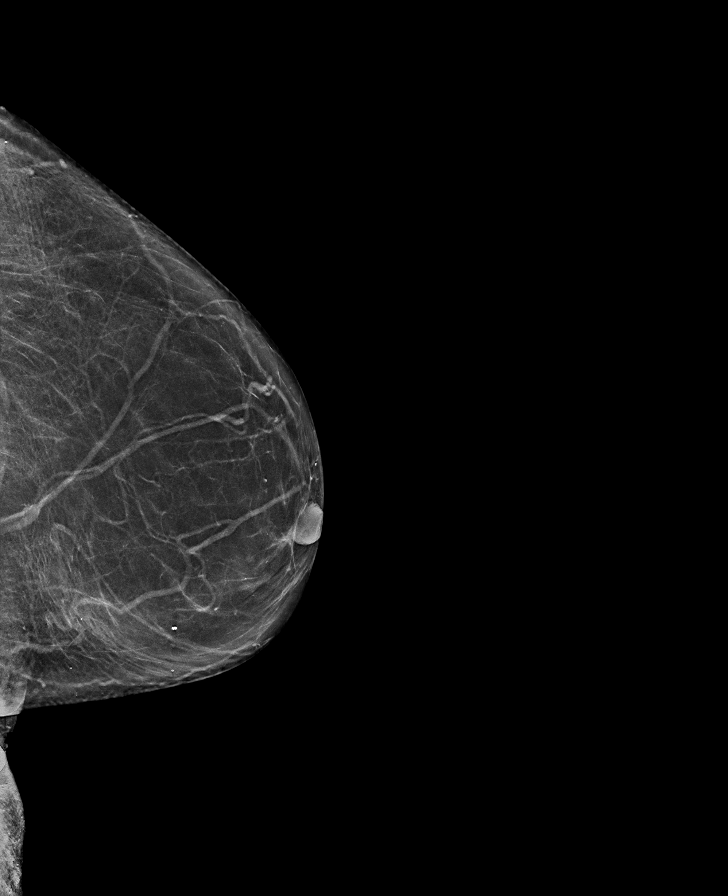

[L MLO synth-2D]
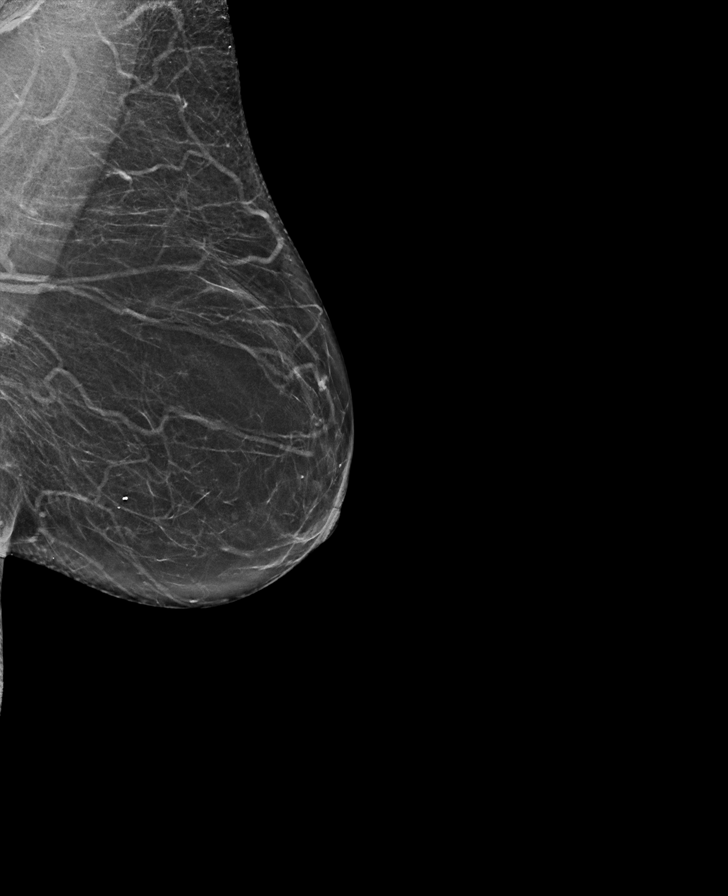

[L CC tomo · tomo slice 33/65.0]
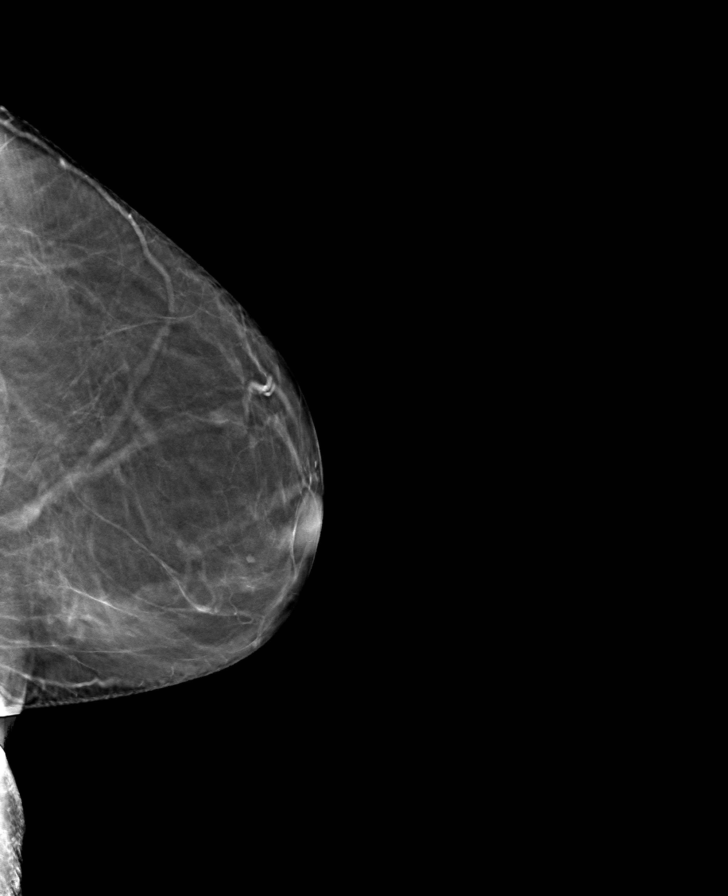

[R MLO tomo · tomo slice 31/60.0]
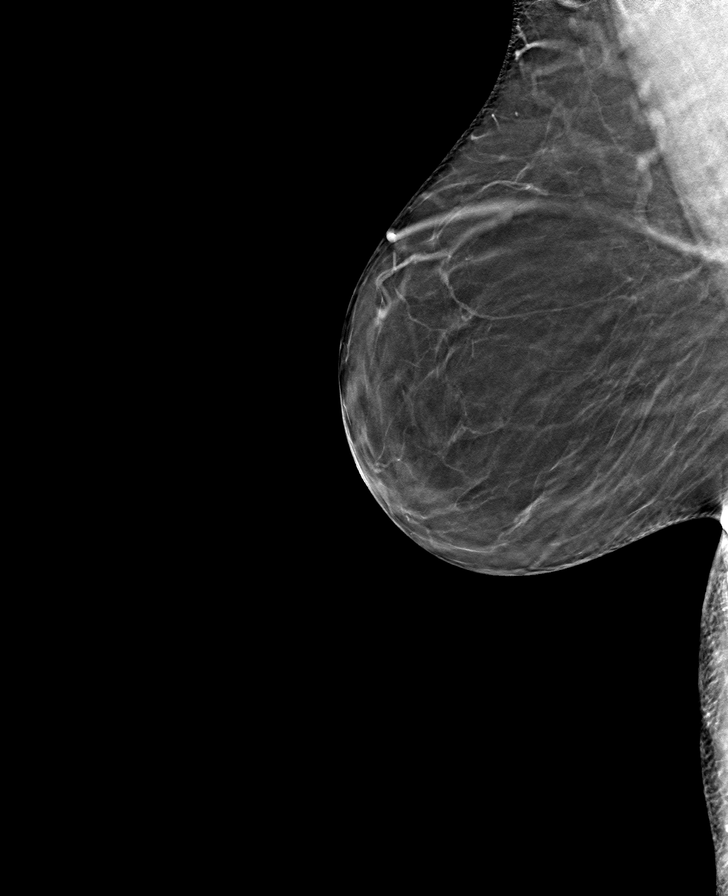

[R CC tomo · tomo slice 32/63.0]
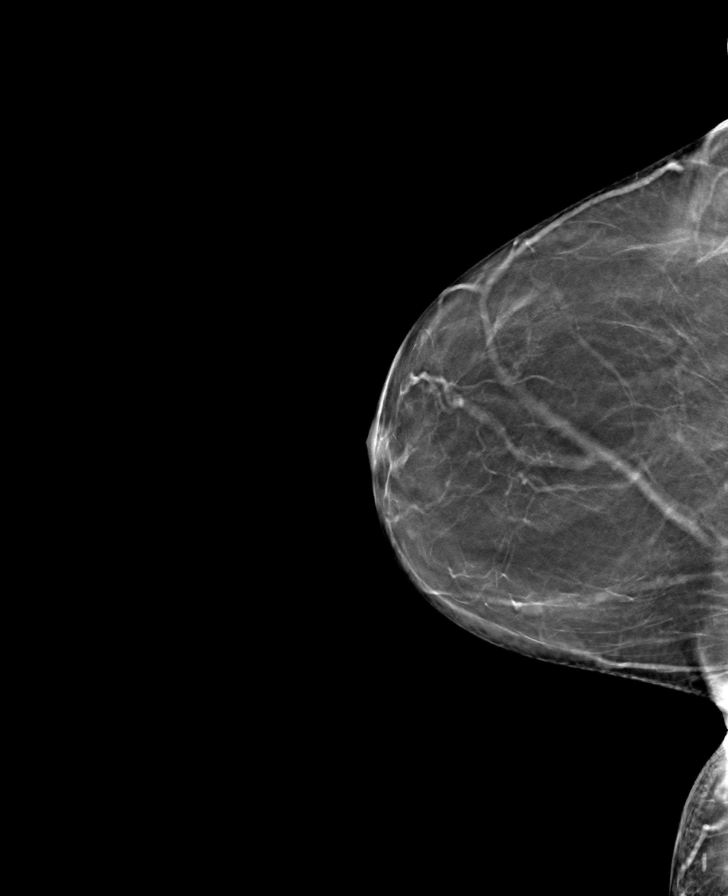

[L MLO tomo · tomo slice 31/62.0]
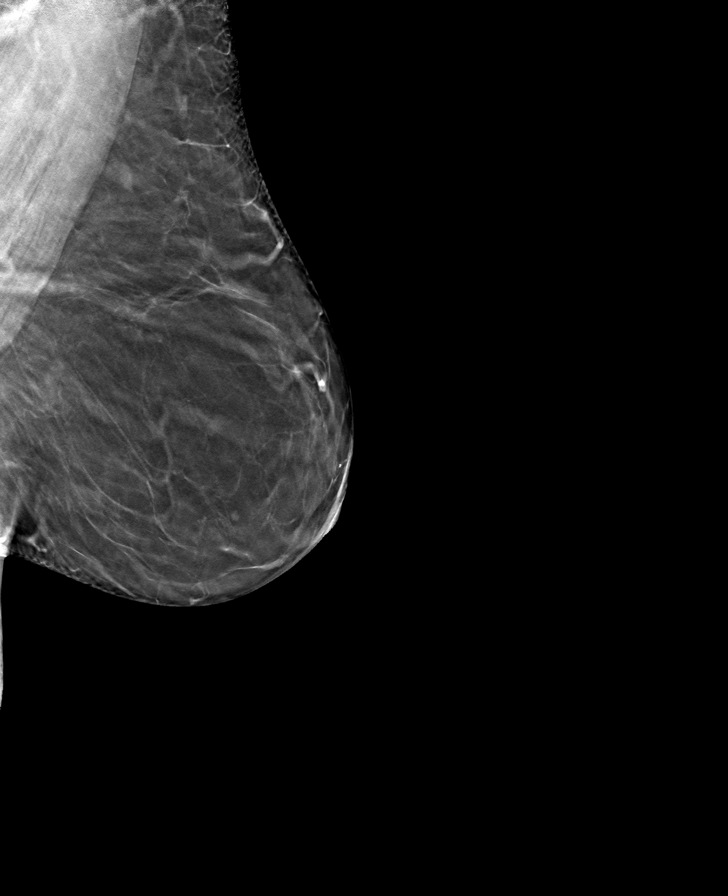

[8 of 24 positions shown; findings below may reference images not displayed]

ACR Breast Density Category b: There are scattered areas of
fibroglandular density.
FINDINGS: There are no findings suspicious for malignancy.
IMPRESSION: No mammographic evidence of malignancy. A result letter of this
screening mammogram will be mailed directly to the patient.

RECOMMENDATION:
Screening mammogram in one year. (Code:51-O-LD2)

BI-RADS CATEGORY  1: Negative.

## 2021-09-06 DIAGNOSIS — E1169 Type 2 diabetes mellitus with other specified complication: Secondary | ICD-10-CM | POA: Diagnosis not present

## 2021-09-06 DIAGNOSIS — I129 Hypertensive chronic kidney disease with stage 1 through stage 4 chronic kidney disease, or unspecified chronic kidney disease: Secondary | ICD-10-CM | POA: Diagnosis not present

## 2021-09-18 DIAGNOSIS — E1169 Type 2 diabetes mellitus with other specified complication: Secondary | ICD-10-CM | POA: Diagnosis not present

## 2021-09-25 DIAGNOSIS — Z6827 Body mass index (BMI) 27.0-27.9, adult: Secondary | ICD-10-CM | POA: Diagnosis not present

## 2021-09-25 DIAGNOSIS — E782 Mixed hyperlipidemia: Secondary | ICD-10-CM | POA: Diagnosis not present

## 2021-09-25 DIAGNOSIS — N183 Chronic kidney disease, stage 3 unspecified: Secondary | ICD-10-CM | POA: Diagnosis not present

## 2021-09-25 DIAGNOSIS — F17210 Nicotine dependence, cigarettes, uncomplicated: Secondary | ICD-10-CM | POA: Diagnosis not present

## 2021-09-25 DIAGNOSIS — I129 Hypertensive chronic kidney disease with stage 1 through stage 4 chronic kidney disease, or unspecified chronic kidney disease: Secondary | ICD-10-CM | POA: Diagnosis not present

## 2021-09-25 DIAGNOSIS — J449 Chronic obstructive pulmonary disease, unspecified: Secondary | ICD-10-CM | POA: Diagnosis not present

## 2021-09-25 DIAGNOSIS — E1169 Type 2 diabetes mellitus with other specified complication: Secondary | ICD-10-CM | POA: Diagnosis not present

## 2021-10-07 DIAGNOSIS — I129 Hypertensive chronic kidney disease with stage 1 through stage 4 chronic kidney disease, or unspecified chronic kidney disease: Secondary | ICD-10-CM | POA: Diagnosis not present

## 2021-10-07 DIAGNOSIS — E1169 Type 2 diabetes mellitus with other specified complication: Secondary | ICD-10-CM | POA: Diagnosis not present

## 2021-10-17 DIAGNOSIS — E890 Postprocedural hypothyroidism: Secondary | ICD-10-CM | POA: Diagnosis not present

## 2021-11-06 DIAGNOSIS — Z1231 Encounter for screening mammogram for malignant neoplasm of breast: Secondary | ICD-10-CM | POA: Diagnosis not present

## 2021-11-06 DIAGNOSIS — E2839 Other primary ovarian failure: Secondary | ICD-10-CM | POA: Diagnosis not present

## 2021-11-06 DIAGNOSIS — M8589 Other specified disorders of bone density and structure, multiple sites: Secondary | ICD-10-CM | POA: Diagnosis not present

## 2021-11-07 DIAGNOSIS — D509 Iron deficiency anemia, unspecified: Secondary | ICD-10-CM | POA: Diagnosis not present

## 2021-11-07 DIAGNOSIS — I129 Hypertensive chronic kidney disease with stage 1 through stage 4 chronic kidney disease, or unspecified chronic kidney disease: Secondary | ICD-10-CM | POA: Diagnosis not present

## 2021-11-21 DIAGNOSIS — Z20822 Contact with and (suspected) exposure to covid-19: Secondary | ICD-10-CM | POA: Diagnosis not present

## 2021-11-21 DIAGNOSIS — R0602 Shortness of breath: Secondary | ICD-10-CM | POA: Diagnosis not present

## 2021-11-21 DIAGNOSIS — J441 Chronic obstructive pulmonary disease with (acute) exacerbation: Secondary | ICD-10-CM | POA: Diagnosis not present

## 2021-11-26 DIAGNOSIS — R059 Cough, unspecified: Secondary | ICD-10-CM | POA: Diagnosis not present

## 2021-11-26 DIAGNOSIS — Z6827 Body mass index (BMI) 27.0-27.9, adult: Secondary | ICD-10-CM | POA: Diagnosis not present

## 2021-11-26 DIAGNOSIS — Z1231 Encounter for screening mammogram for malignant neoplasm of breast: Secondary | ICD-10-CM | POA: Diagnosis not present

## 2021-11-26 DIAGNOSIS — J441 Chronic obstructive pulmonary disease with (acute) exacerbation: Secondary | ICD-10-CM | POA: Diagnosis not present

## 2021-12-07 DIAGNOSIS — D509 Iron deficiency anemia, unspecified: Secondary | ICD-10-CM | POA: Diagnosis not present

## 2021-12-07 DIAGNOSIS — I129 Hypertensive chronic kidney disease with stage 1 through stage 4 chronic kidney disease, or unspecified chronic kidney disease: Secondary | ICD-10-CM | POA: Diagnosis not present

## 2022-01-07 DIAGNOSIS — D509 Iron deficiency anemia, unspecified: Secondary | ICD-10-CM | POA: Diagnosis not present

## 2022-01-07 DIAGNOSIS — I129 Hypertensive chronic kidney disease with stage 1 through stage 4 chronic kidney disease, or unspecified chronic kidney disease: Secondary | ICD-10-CM | POA: Diagnosis not present

## 2022-01-20 DIAGNOSIS — Z23 Encounter for immunization: Secondary | ICD-10-CM | POA: Diagnosis not present

## 2022-01-26 DIAGNOSIS — I129 Hypertensive chronic kidney disease with stage 1 through stage 4 chronic kidney disease, or unspecified chronic kidney disease: Secondary | ICD-10-CM | POA: Diagnosis not present

## 2022-01-26 DIAGNOSIS — E890 Postprocedural hypothyroidism: Secondary | ICD-10-CM | POA: Diagnosis not present

## 2022-01-26 DIAGNOSIS — E1169 Type 2 diabetes mellitus with other specified complication: Secondary | ICD-10-CM | POA: Diagnosis not present

## 2022-02-02 DIAGNOSIS — E1169 Type 2 diabetes mellitus with other specified complication: Secondary | ICD-10-CM | POA: Diagnosis not present

## 2022-02-02 DIAGNOSIS — I129 Hypertensive chronic kidney disease with stage 1 through stage 4 chronic kidney disease, or unspecified chronic kidney disease: Secondary | ICD-10-CM | POA: Diagnosis not present

## 2022-02-02 DIAGNOSIS — Z1331 Encounter for screening for depression: Secondary | ICD-10-CM | POA: Diagnosis not present

## 2022-02-02 DIAGNOSIS — Z1339 Encounter for screening examination for other mental health and behavioral disorders: Secondary | ICD-10-CM | POA: Diagnosis not present

## 2022-02-02 DIAGNOSIS — Z136 Encounter for screening for cardiovascular disorders: Secondary | ICD-10-CM | POA: Diagnosis not present

## 2022-02-02 DIAGNOSIS — F17210 Nicotine dependence, cigarettes, uncomplicated: Secondary | ICD-10-CM | POA: Diagnosis not present

## 2022-02-02 DIAGNOSIS — E782 Mixed hyperlipidemia: Secondary | ICD-10-CM | POA: Diagnosis not present

## 2022-02-02 DIAGNOSIS — Z139 Encounter for screening, unspecified: Secondary | ICD-10-CM | POA: Diagnosis not present

## 2022-02-02 DIAGNOSIS — Z6827 Body mass index (BMI) 27.0-27.9, adult: Secondary | ICD-10-CM | POA: Diagnosis not present

## 2022-02-02 DIAGNOSIS — Z0000 Encounter for general adult medical examination without abnormal findings: Secondary | ICD-10-CM | POA: Diagnosis not present

## 2022-02-02 DIAGNOSIS — N183 Chronic kidney disease, stage 3 unspecified: Secondary | ICD-10-CM | POA: Diagnosis not present

## 2022-02-02 DIAGNOSIS — Z720 Tobacco use: Secondary | ICD-10-CM | POA: Diagnosis not present

## 2022-02-05 DIAGNOSIS — R809 Proteinuria, unspecified: Secondary | ICD-10-CM | POA: Diagnosis not present

## 2022-02-05 DIAGNOSIS — Z720 Tobacco use: Secondary | ICD-10-CM | POA: Diagnosis not present

## 2022-02-05 DIAGNOSIS — E1122 Type 2 diabetes mellitus with diabetic chronic kidney disease: Secondary | ICD-10-CM | POA: Diagnosis not present

## 2022-02-05 DIAGNOSIS — N183 Chronic kidney disease, stage 3 unspecified: Secondary | ICD-10-CM | POA: Diagnosis not present

## 2022-02-05 DIAGNOSIS — I129 Hypertensive chronic kidney disease with stage 1 through stage 4 chronic kidney disease, or unspecified chronic kidney disease: Secondary | ICD-10-CM | POA: Diagnosis not present

## 2022-02-05 DIAGNOSIS — E785 Hyperlipidemia, unspecified: Secondary | ICD-10-CM | POA: Diagnosis not present

## 2022-02-06 DIAGNOSIS — I129 Hypertensive chronic kidney disease with stage 1 through stage 4 chronic kidney disease, or unspecified chronic kidney disease: Secondary | ICD-10-CM | POA: Diagnosis not present

## 2022-02-06 DIAGNOSIS — J441 Chronic obstructive pulmonary disease with (acute) exacerbation: Secondary | ICD-10-CM | POA: Diagnosis not present

## 2022-02-11 IMAGING — US US FNA BIOPSY THYROID 1ST LESION
1 series · 13 of 21 positions shown · non-contrast
Comparison: Thyroid ultrasound 02/03/2021

MEDICATIONS:
None

COMPLICATIONS:
None immediate.

INDICATION: Indeterminate thyroid nodule

EXAM:
ULTRASOUND GUIDED FINE NEEDLE ASPIRATION OF INDETERMINATE THYROID
NODULE
TECHNIQUE: Informed written consent was obtained from the patient after a
discussion of the risks, benefits and alternatives to treatment.
Questions regarding the procedure were encouraged and answered. A
timeout was performed prior to the initiation of the procedure.

[Series 1: us fna biopsy thyroid 1st lesion · 0.06mm/px · 21 acquisitions, 13 frames shown]
[im 1/21]
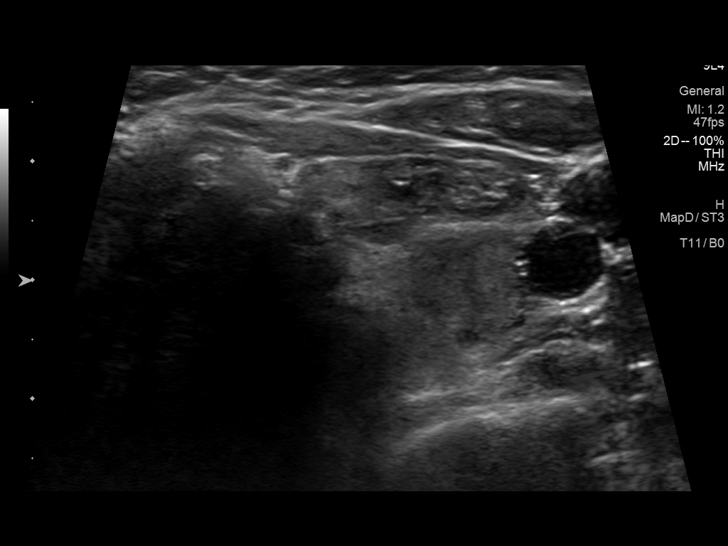
[im 3/21]
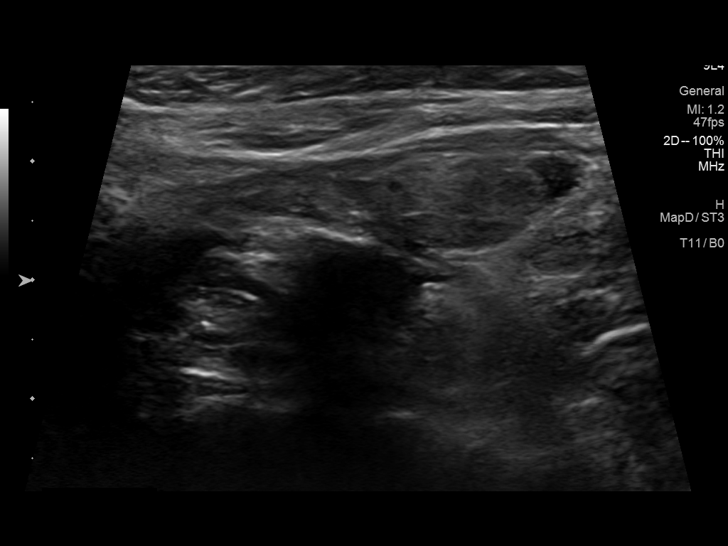
[im 5/21]
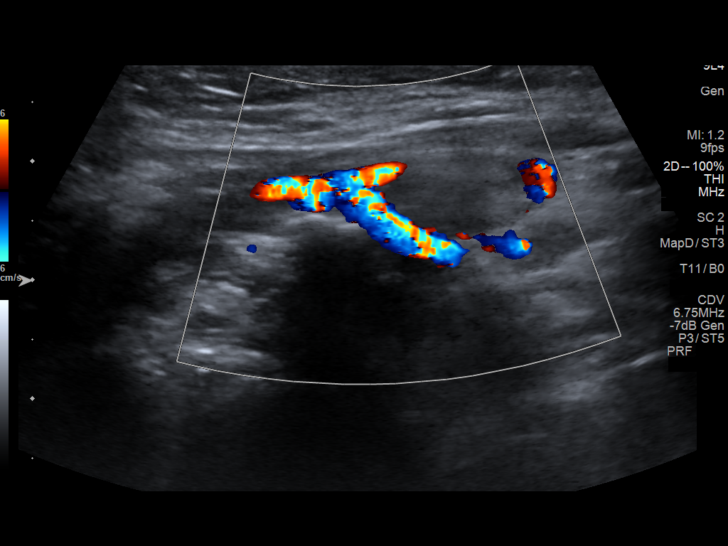
[im 6/21]
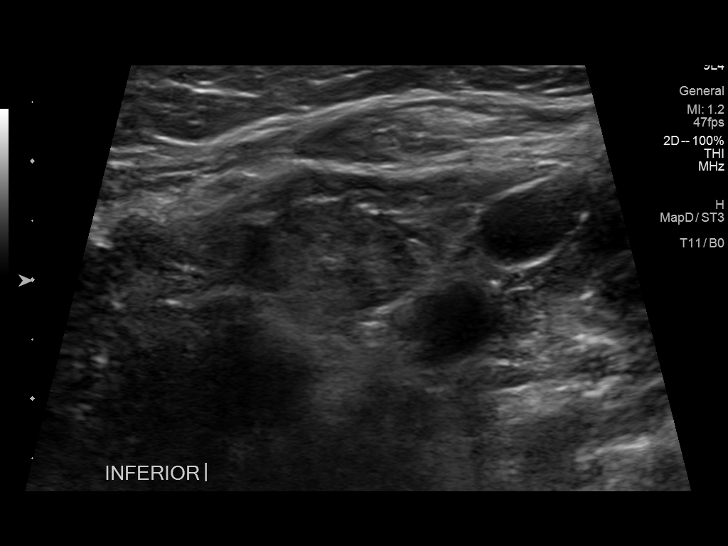
[im 8/21]
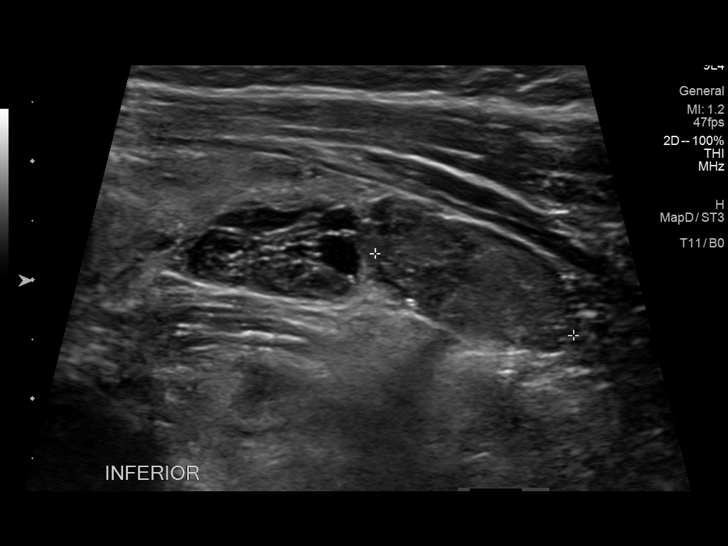
[im 9/21]
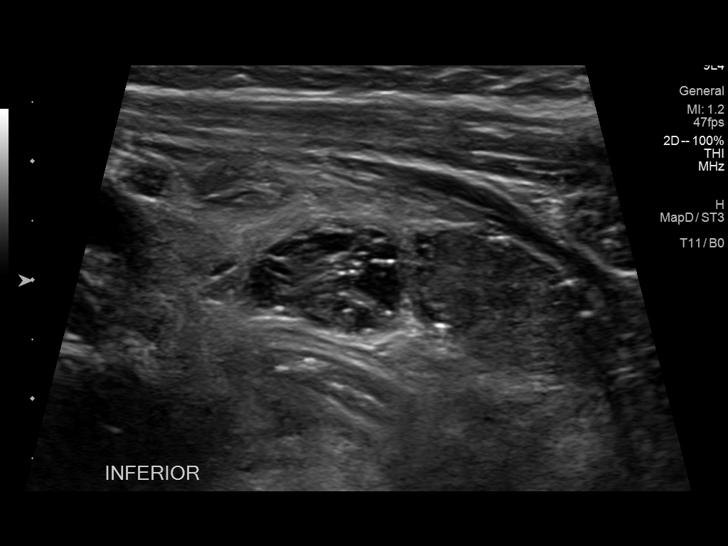
[im 11/21]
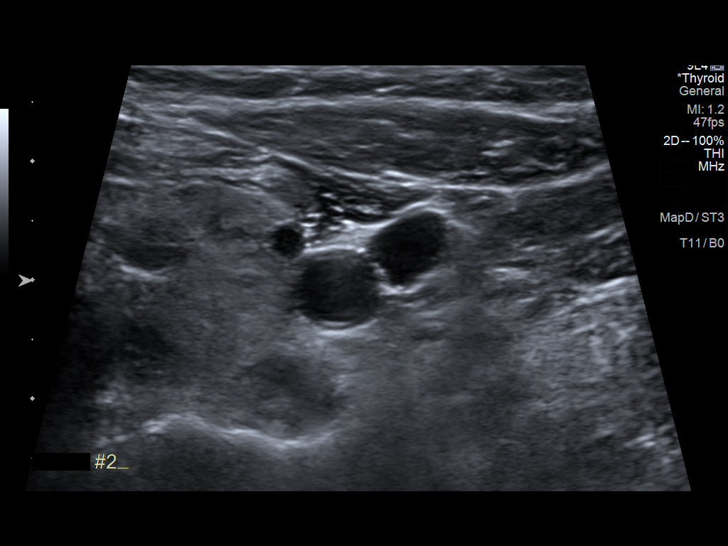
[im 13/21]
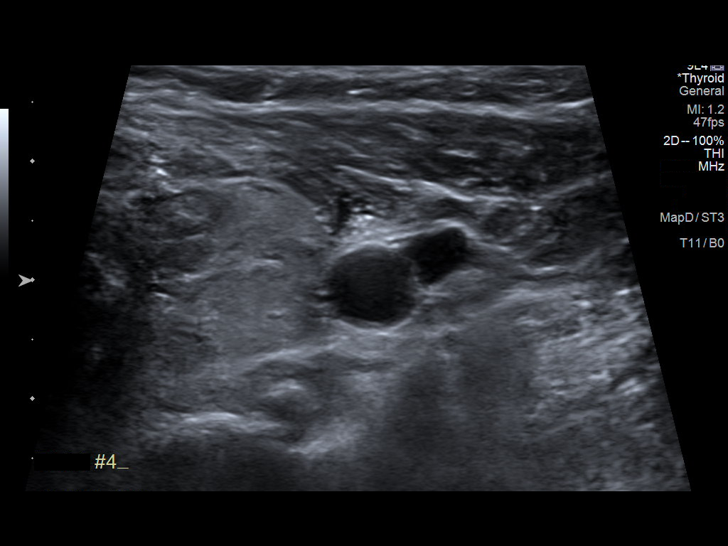
[im 14/21]
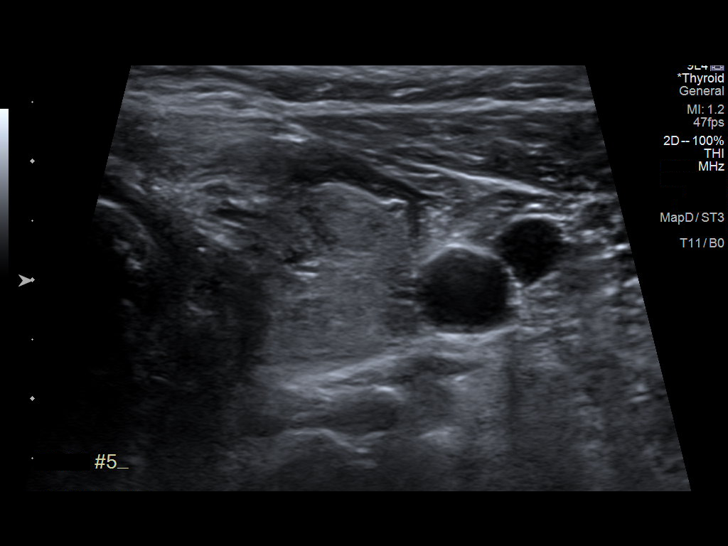
[im 16/21]
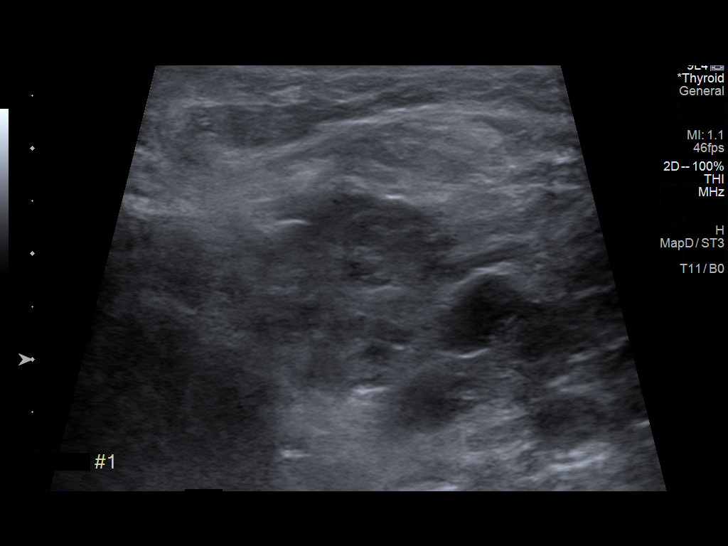
[im 17/21]
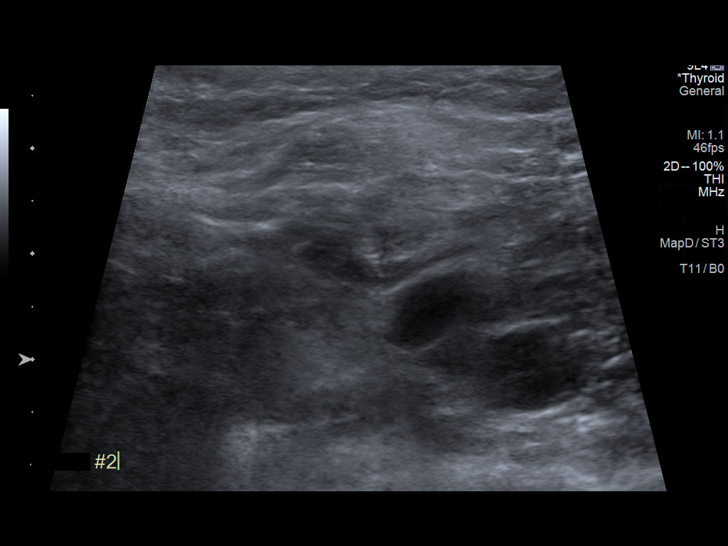
[im 19/21]
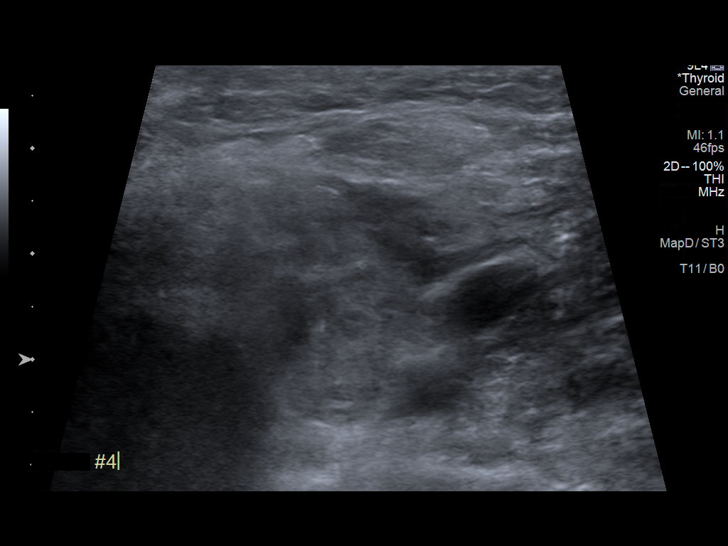
[im 21/21]
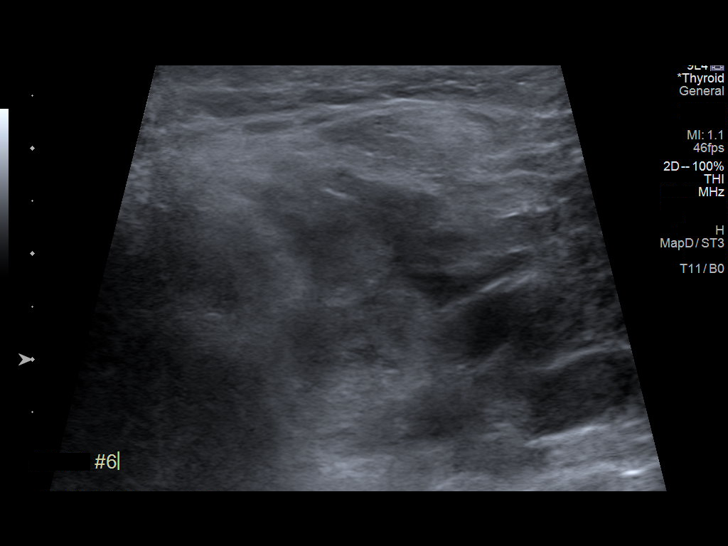

[13 of 21 positions shown; findings below may reference images not displayed]

Pre-procedural ultrasound scanning demonstrated unchanged size and
appearance of the indeterminate nodules within the left superior and
inferior thyroid lobe.

The procedure was planned. The neck was prepped in the usual sterile
fashion, and a sterile drape was applied covering the operative
field. A timeout was performed prior to the initiation of the
procedure. Local anesthesia was provided with 1% lidocaine.

Under direct ultrasound guidance, 6 FNA biopsies were performed of
the left superior thyroid nodule with a 25 gauge needle. Multiple
ultrasound images were saved for procedural documentation purposes.
The samples were prepared and submitted to pathology.

Under direct ultrasound guidance, 6 FNA biopsies were performed of
the left inferior thyroid nodule with a 25 gauge needle. Multiple
ultrasound images were saved for procedural documentation purposes.
The samples were prepared and submitted to pathology.

Limited post procedural scanning was negative for hematoma or
additional complication. Dressings were placed. The patient
tolerated the above procedures procedure well without immediate
postprocedural complication.
FINDINGS: Nodule reference number based on prior diagnostic ultrasound: 4

Maximum size: 2.0 cm

Location: Left; superior

ACR TI-RADS risk category: TR4 (4-6 points)

Reason for biopsy: meets ACR TI-RADS criteria

_________________________________________________________

Nodule reference number based on prior diagnostic ultrasound: 7

Maximum size: 2.0 cm

Location: Left; inferior

ACR TI-RADS risk category: TR4 (4-6 points)

Reason for biopsy: meets ACR TI-RADS criteria

Ultrasound imaging confirms appropriate placement of the needles
within the thyroid nodule.
IMPRESSION: 1. Technically successful ultrasound guided fine needle aspiration
of TI-RADS 4 left superior thyroid nodule.
2. Technically successful ultrasound guided fine needle aspiration
of TI-RADS 4 left inferior thyroid nodule.

## 2022-02-11 IMAGING — US US FNA BIOPSY THYROID 1ST LESION
1 series · 13 of 21 positions shown · non-contrast
Comparison: Thyroid ultrasound 02/03/2021

MEDICATIONS:
None

COMPLICATIONS:
None immediate.

INDICATION: Indeterminate thyroid nodule

EXAM:
ULTRASOUND GUIDED FINE NEEDLE ASPIRATION OF INDETERMINATE THYROID
NODULE
TECHNIQUE: Informed written consent was obtained from the patient after a
discussion of the risks, benefits and alternatives to treatment.
Questions regarding the procedure were encouraged and answered. A
timeout was performed prior to the initiation of the procedure.

[Series 1: us fna biopsy thyroid 1st lesion · 0.06mm/px · 21 acquisitions, 13 frames shown]
[im 1/21]
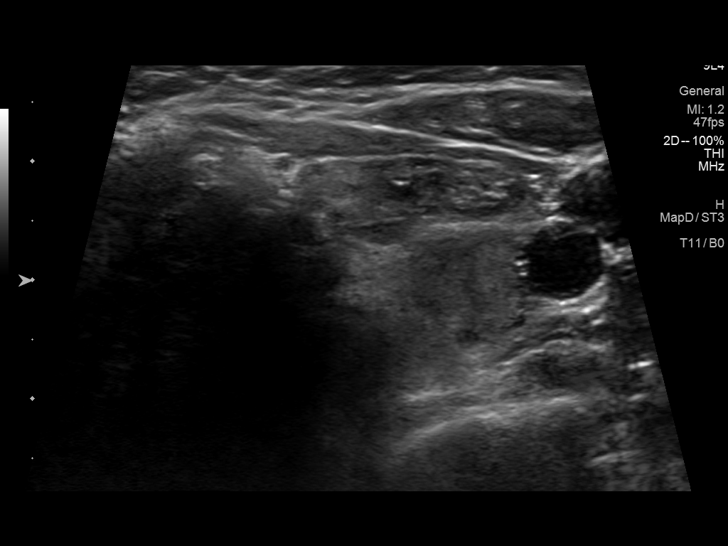
[im 3/21]
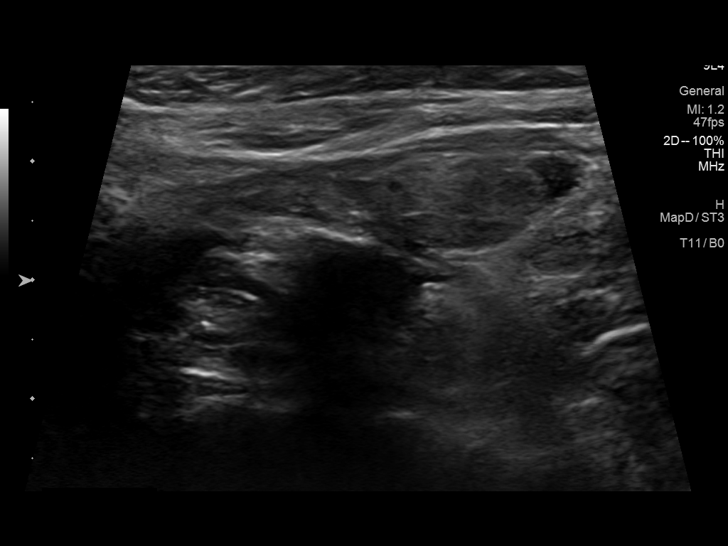
[im 5/21]
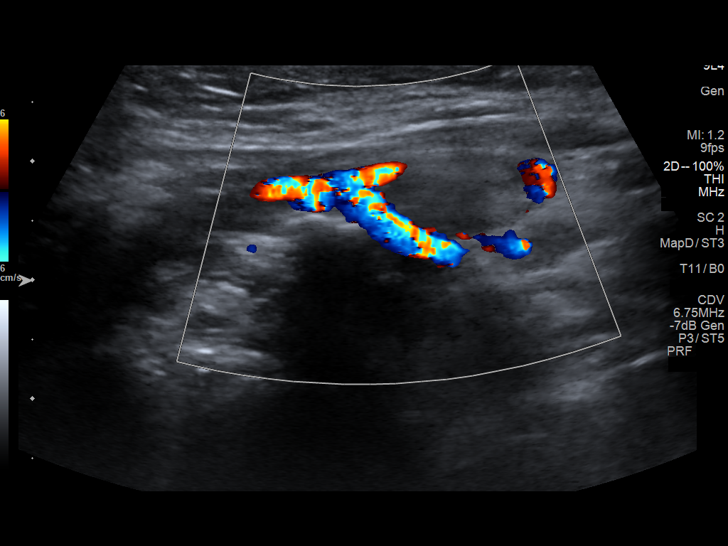
[im 6/21]
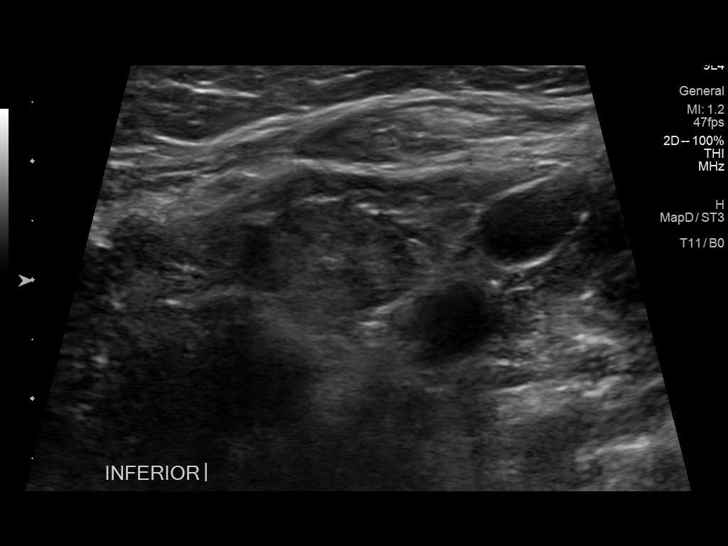
[im 8/21]
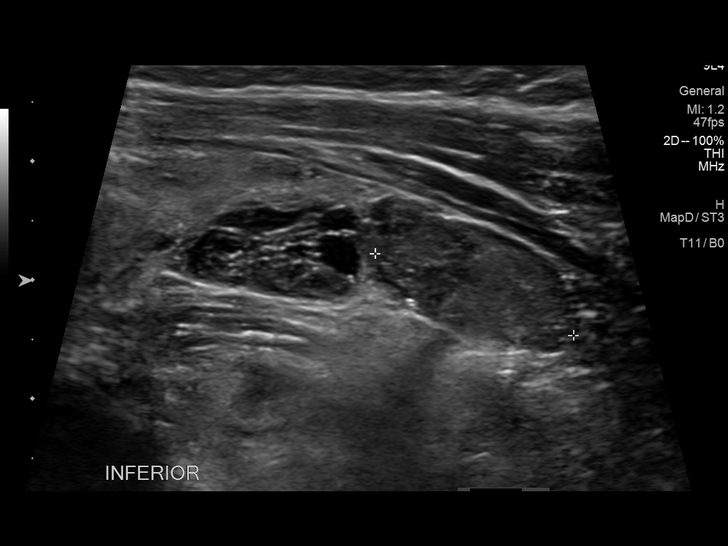
[im 9/21]
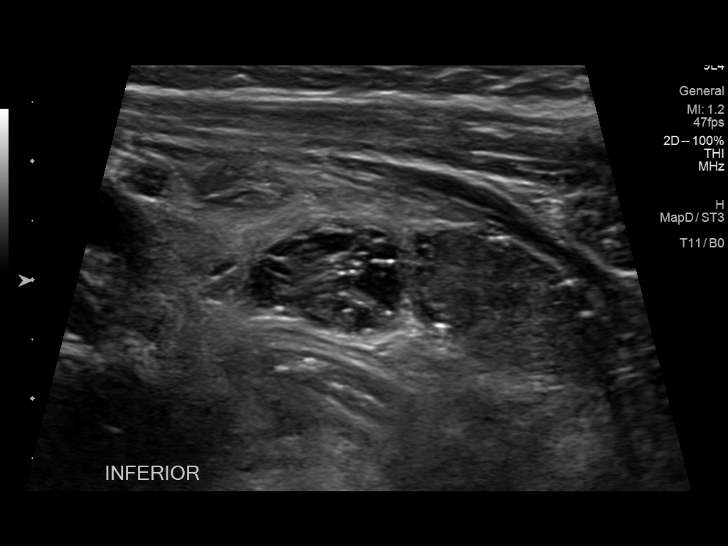
[im 11/21]
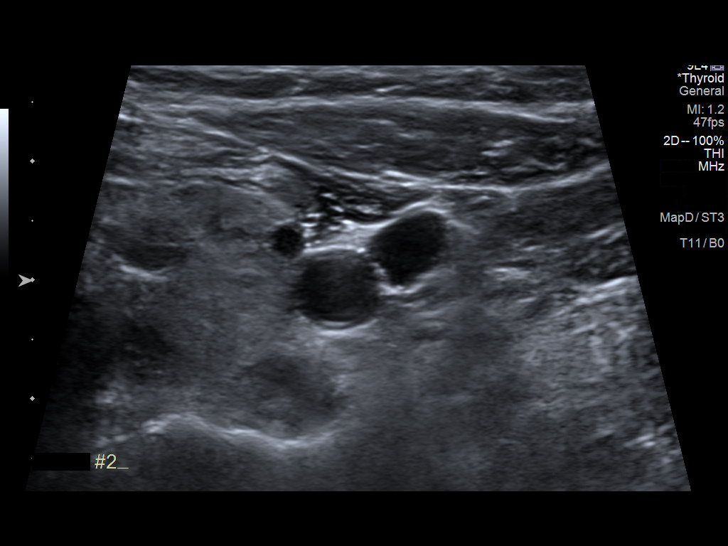
[im 13/21]
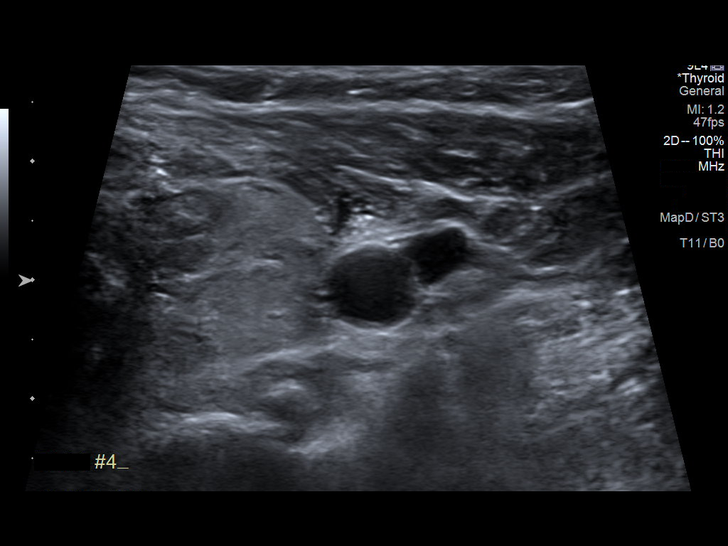
[im 14/21]
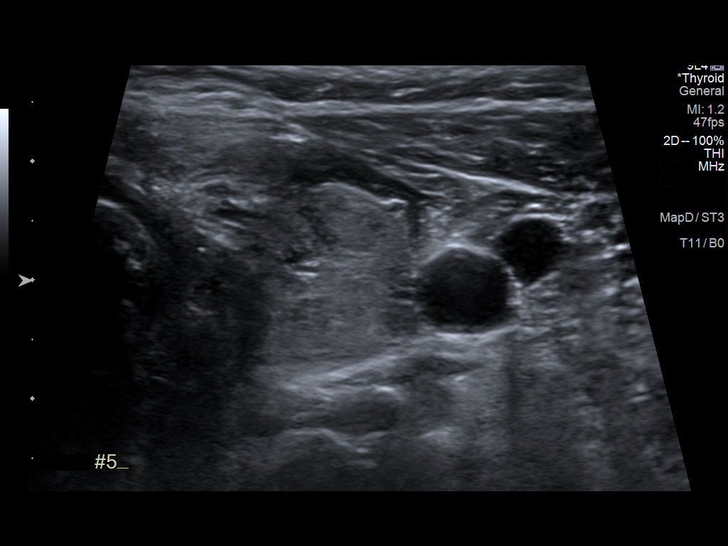
[im 16/21]
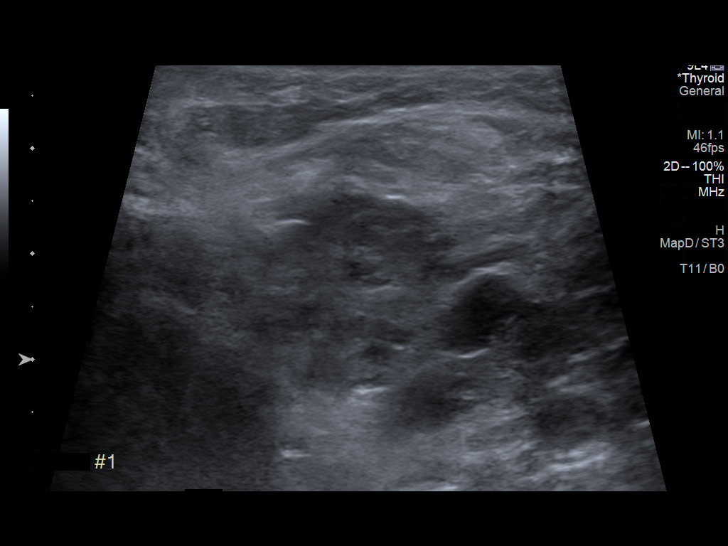
[im 17/21]
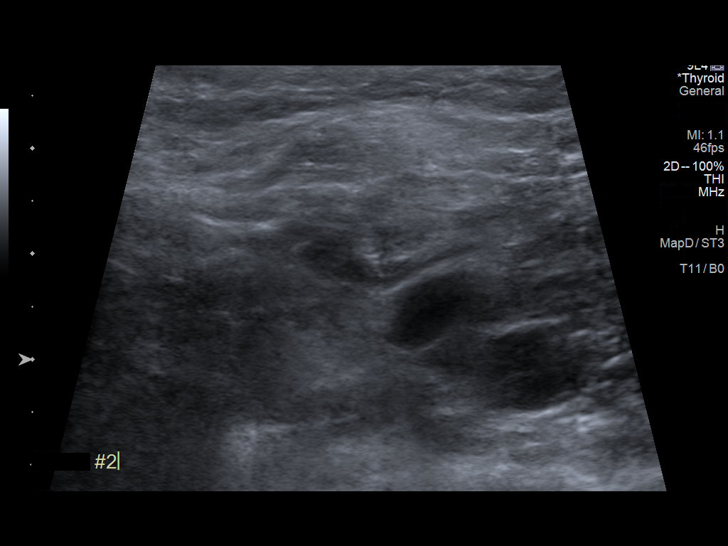
[im 19/21]
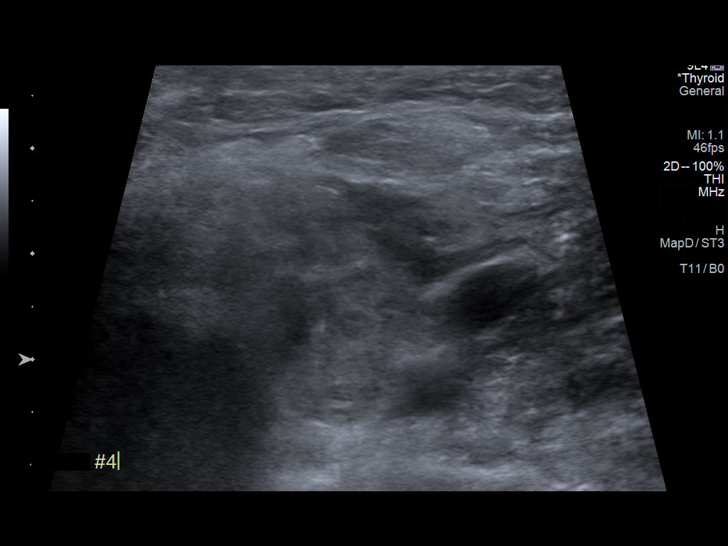
[im 21/21]
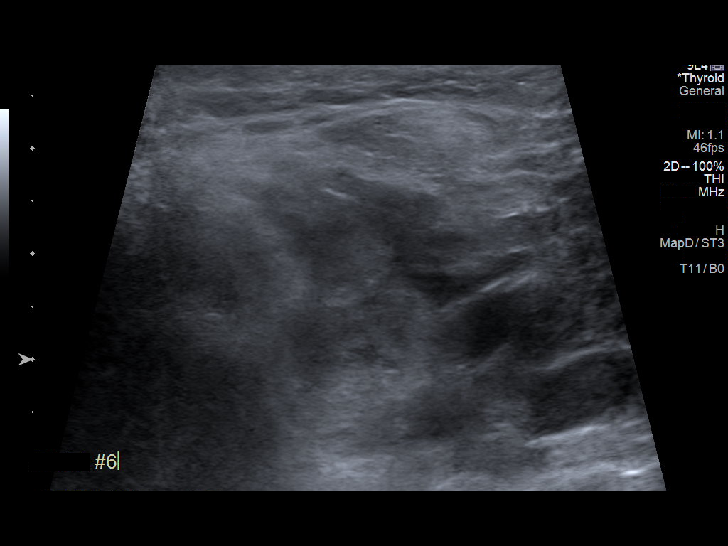

[13 of 21 positions shown; findings below may reference images not displayed]

Pre-procedural ultrasound scanning demonstrated unchanged size and
appearance of the indeterminate nodules within the left superior and
inferior thyroid lobe.

The procedure was planned. The neck was prepped in the usual sterile
fashion, and a sterile drape was applied covering the operative
field. A timeout was performed prior to the initiation of the
procedure. Local anesthesia was provided with 1% lidocaine.

Under direct ultrasound guidance, 6 FNA biopsies were performed of
the left superior thyroid nodule with a 25 gauge needle. Multiple
ultrasound images were saved for procedural documentation purposes.
The samples were prepared and submitted to pathology.

Under direct ultrasound guidance, 6 FNA biopsies were performed of
the left inferior thyroid nodule with a 25 gauge needle. Multiple
ultrasound images were saved for procedural documentation purposes.
The samples were prepared and submitted to pathology.

Limited post procedural scanning was negative for hematoma or
additional complication. Dressings were placed. The patient
tolerated the above procedures procedure well without immediate
postprocedural complication.
FINDINGS: Nodule reference number based on prior diagnostic ultrasound: 4

Maximum size: 2.0 cm

Location: Left; superior

ACR TI-RADS risk category: TR4 (4-6 points)

Reason for biopsy: meets ACR TI-RADS criteria

_________________________________________________________

Nodule reference number based on prior diagnostic ultrasound: 7

Maximum size: 2.0 cm

Location: Left; inferior

ACR TI-RADS risk category: TR4 (4-6 points)

Reason for biopsy: meets ACR TI-RADS criteria

Ultrasound imaging confirms appropriate placement of the needles
within the thyroid nodule.
IMPRESSION: 1. Technically successful ultrasound guided fine needle aspiration
of TI-RADS 4 left superior thyroid nodule.
2. Technically successful ultrasound guided fine needle aspiration
of TI-RADS 4 left inferior thyroid nodule.

## 2022-03-09 DIAGNOSIS — J441 Chronic obstructive pulmonary disease with (acute) exacerbation: Secondary | ICD-10-CM | POA: Diagnosis not present

## 2022-03-09 DIAGNOSIS — I129 Hypertensive chronic kidney disease with stage 1 through stage 4 chronic kidney disease, or unspecified chronic kidney disease: Secondary | ICD-10-CM | POA: Diagnosis not present

## 2022-04-09 DIAGNOSIS — J441 Chronic obstructive pulmonary disease with (acute) exacerbation: Secondary | ICD-10-CM | POA: Diagnosis not present

## 2022-04-09 DIAGNOSIS — I129 Hypertensive chronic kidney disease with stage 1 through stage 4 chronic kidney disease, or unspecified chronic kidney disease: Secondary | ICD-10-CM | POA: Diagnosis not present

## 2022-05-08 DIAGNOSIS — I129 Hypertensive chronic kidney disease with stage 1 through stage 4 chronic kidney disease, or unspecified chronic kidney disease: Secondary | ICD-10-CM | POA: Diagnosis not present

## 2022-05-08 DIAGNOSIS — J441 Chronic obstructive pulmonary disease with (acute) exacerbation: Secondary | ICD-10-CM | POA: Diagnosis not present

## 2022-06-08 DIAGNOSIS — J441 Chronic obstructive pulmonary disease with (acute) exacerbation: Secondary | ICD-10-CM | POA: Diagnosis not present

## 2022-06-08 DIAGNOSIS — I129 Hypertensive chronic kidney disease with stage 1 through stage 4 chronic kidney disease, or unspecified chronic kidney disease: Secondary | ICD-10-CM | POA: Diagnosis not present

## 2022-06-22 DIAGNOSIS — E890 Postprocedural hypothyroidism: Secondary | ICD-10-CM | POA: Diagnosis not present

## 2022-06-22 DIAGNOSIS — I129 Hypertensive chronic kidney disease with stage 1 through stage 4 chronic kidney disease, or unspecified chronic kidney disease: Secondary | ICD-10-CM | POA: Diagnosis not present

## 2022-06-22 DIAGNOSIS — E1169 Type 2 diabetes mellitus with other specified complication: Secondary | ICD-10-CM | POA: Diagnosis not present

## 2022-07-02 DIAGNOSIS — Z6826 Body mass index (BMI) 26.0-26.9, adult: Secondary | ICD-10-CM | POA: Diagnosis not present

## 2022-07-02 DIAGNOSIS — J449 Chronic obstructive pulmonary disease, unspecified: Secondary | ICD-10-CM | POA: Diagnosis not present

## 2022-07-02 DIAGNOSIS — E782 Mixed hyperlipidemia: Secondary | ICD-10-CM | POA: Diagnosis not present

## 2022-07-02 DIAGNOSIS — N183 Chronic kidney disease, stage 3 unspecified: Secondary | ICD-10-CM | POA: Diagnosis not present

## 2022-07-02 DIAGNOSIS — F17210 Nicotine dependence, cigarettes, uncomplicated: Secondary | ICD-10-CM | POA: Diagnosis not present

## 2022-07-02 DIAGNOSIS — F17200 Nicotine dependence, unspecified, uncomplicated: Secondary | ICD-10-CM | POA: Diagnosis not present

## 2022-07-02 DIAGNOSIS — E1140 Type 2 diabetes mellitus with diabetic neuropathy, unspecified: Secondary | ICD-10-CM | POA: Diagnosis not present

## 2022-07-02 DIAGNOSIS — E1169 Type 2 diabetes mellitus with other specified complication: Secondary | ICD-10-CM | POA: Diagnosis not present

## 2022-07-02 DIAGNOSIS — I129 Hypertensive chronic kidney disease with stage 1 through stage 4 chronic kidney disease, or unspecified chronic kidney disease: Secondary | ICD-10-CM | POA: Diagnosis not present

## 2022-07-08 DIAGNOSIS — E890 Postprocedural hypothyroidism: Secondary | ICD-10-CM | POA: Diagnosis not present

## 2022-07-08 DIAGNOSIS — I129 Hypertensive chronic kidney disease with stage 1 through stage 4 chronic kidney disease, or unspecified chronic kidney disease: Secondary | ICD-10-CM | POA: Diagnosis not present

## 2022-07-16 DIAGNOSIS — F17210 Nicotine dependence, cigarettes, uncomplicated: Secondary | ICD-10-CM | POA: Diagnosis not present

## 2022-07-16 DIAGNOSIS — Z87891 Personal history of nicotine dependence: Secondary | ICD-10-CM | POA: Diagnosis not present

## 2022-07-16 DIAGNOSIS — Z122 Encounter for screening for malignant neoplasm of respiratory organs: Secondary | ICD-10-CM | POA: Diagnosis not present

## 2022-08-08 DIAGNOSIS — I129 Hypertensive chronic kidney disease with stage 1 through stage 4 chronic kidney disease, or unspecified chronic kidney disease: Secondary | ICD-10-CM | POA: Diagnosis not present

## 2022-08-08 DIAGNOSIS — E890 Postprocedural hypothyroidism: Secondary | ICD-10-CM | POA: Diagnosis not present

## 2022-09-03 DIAGNOSIS — E119 Type 2 diabetes mellitus without complications: Secondary | ICD-10-CM | POA: Diagnosis not present

## 2022-09-03 DIAGNOSIS — Z7984 Long term (current) use of oral hypoglycemic drugs: Secondary | ICD-10-CM | POA: Diagnosis not present

## 2022-09-07 DIAGNOSIS — E890 Postprocedural hypothyroidism: Secondary | ICD-10-CM | POA: Diagnosis not present

## 2022-09-07 DIAGNOSIS — I129 Hypertensive chronic kidney disease with stage 1 through stage 4 chronic kidney disease, or unspecified chronic kidney disease: Secondary | ICD-10-CM | POA: Diagnosis not present

## 2022-10-08 DIAGNOSIS — I129 Hypertensive chronic kidney disease with stage 1 through stage 4 chronic kidney disease, or unspecified chronic kidney disease: Secondary | ICD-10-CM | POA: Diagnosis not present

## 2022-10-08 DIAGNOSIS — E890 Postprocedural hypothyroidism: Secondary | ICD-10-CM | POA: Diagnosis not present

## 2022-11-03 DIAGNOSIS — I129 Hypertensive chronic kidney disease with stage 1 through stage 4 chronic kidney disease, or unspecified chronic kidney disease: Secondary | ICD-10-CM | POA: Diagnosis not present

## 2022-11-03 DIAGNOSIS — E1169 Type 2 diabetes mellitus with other specified complication: Secondary | ICD-10-CM | POA: Diagnosis not present

## 2022-11-08 DIAGNOSIS — I129 Hypertensive chronic kidney disease with stage 1 through stage 4 chronic kidney disease, or unspecified chronic kidney disease: Secondary | ICD-10-CM | POA: Diagnosis not present

## 2022-11-08 DIAGNOSIS — E890 Postprocedural hypothyroidism: Secondary | ICD-10-CM | POA: Diagnosis not present

## 2022-11-12 DIAGNOSIS — I129 Hypertensive chronic kidney disease with stage 1 through stage 4 chronic kidney disease, or unspecified chronic kidney disease: Secondary | ICD-10-CM | POA: Diagnosis not present

## 2022-11-12 DIAGNOSIS — Z6826 Body mass index (BMI) 26.0-26.9, adult: Secondary | ICD-10-CM | POA: Diagnosis not present

## 2022-11-12 DIAGNOSIS — N183 Chronic kidney disease, stage 3 unspecified: Secondary | ICD-10-CM | POA: Diagnosis not present

## 2022-11-12 DIAGNOSIS — E1169 Type 2 diabetes mellitus with other specified complication: Secondary | ICD-10-CM | POA: Diagnosis not present

## 2022-11-12 DIAGNOSIS — Z1331 Encounter for screening for depression: Secondary | ICD-10-CM | POA: Diagnosis not present

## 2022-11-12 DIAGNOSIS — Z23 Encounter for immunization: Secondary | ICD-10-CM | POA: Diagnosis not present

## 2022-11-12 DIAGNOSIS — E782 Mixed hyperlipidemia: Secondary | ICD-10-CM | POA: Diagnosis not present

## 2022-12-16 ENCOUNTER — Other Ambulatory Visit: Payer: Self-pay | Admitting: Family Medicine

## 2022-12-16 DIAGNOSIS — Z1231 Encounter for screening mammogram for malignant neoplasm of breast: Secondary | ICD-10-CM

## 2022-12-23 ENCOUNTER — Ambulatory Visit
Admission: RE | Admit: 2022-12-23 | Discharge: 2022-12-23 | Disposition: A | Discharge status: Home / Self Care | Payer: Medicare Other | Source: Ambulatory Visit | Attending: Family Medicine | Admitting: Family Medicine

## 2022-12-23 DIAGNOSIS — Z1231 Encounter for screening mammogram for malignant neoplasm of breast: Secondary | ICD-10-CM | POA: Diagnosis not present

## 2023-01-08 DIAGNOSIS — E890 Postprocedural hypothyroidism: Secondary | ICD-10-CM | POA: Diagnosis not present

## 2023-01-08 DIAGNOSIS — I129 Hypertensive chronic kidney disease with stage 1 through stage 4 chronic kidney disease, or unspecified chronic kidney disease: Secondary | ICD-10-CM | POA: Diagnosis not present

## 2023-02-07 DIAGNOSIS — I129 Hypertensive chronic kidney disease with stage 1 through stage 4 chronic kidney disease, or unspecified chronic kidney disease: Secondary | ICD-10-CM | POA: Diagnosis not present

## 2023-02-07 DIAGNOSIS — E890 Postprocedural hypothyroidism: Secondary | ICD-10-CM | POA: Diagnosis not present

## 2023-02-23 DIAGNOSIS — Z136 Encounter for screening for cardiovascular disorders: Secondary | ICD-10-CM | POA: Diagnosis not present

## 2023-02-23 DIAGNOSIS — Z0000 Encounter for general adult medical examination without abnormal findings: Secondary | ICD-10-CM | POA: Diagnosis not present

## 2023-02-23 DIAGNOSIS — Z1231 Encounter for screening mammogram for malignant neoplasm of breast: Secondary | ICD-10-CM | POA: Diagnosis not present

## 2023-02-23 DIAGNOSIS — E669 Obesity, unspecified: Secondary | ICD-10-CM | POA: Diagnosis not present

## 2023-02-23 DIAGNOSIS — Z1339 Encounter for screening examination for other mental health and behavioral disorders: Secondary | ICD-10-CM | POA: Diagnosis not present

## 2023-02-23 DIAGNOSIS — Z1331 Encounter for screening for depression: Secondary | ICD-10-CM | POA: Diagnosis not present

## 2023-02-23 DIAGNOSIS — Z139 Encounter for screening, unspecified: Secondary | ICD-10-CM | POA: Diagnosis not present

## 2023-03-10 DIAGNOSIS — E890 Postprocedural hypothyroidism: Secondary | ICD-10-CM | POA: Diagnosis not present

## 2023-03-10 DIAGNOSIS — I129 Hypertensive chronic kidney disease with stage 1 through stage 4 chronic kidney disease, or unspecified chronic kidney disease: Secondary | ICD-10-CM | POA: Diagnosis not present

## 2023-03-12 DIAGNOSIS — E1169 Type 2 diabetes mellitus with other specified complication: Secondary | ICD-10-CM | POA: Diagnosis not present

## 2023-03-12 DIAGNOSIS — I129 Hypertensive chronic kidney disease with stage 1 through stage 4 chronic kidney disease, or unspecified chronic kidney disease: Secondary | ICD-10-CM | POA: Diagnosis not present

## 2023-03-18 DIAGNOSIS — N183 Chronic kidney disease, stage 3 unspecified: Secondary | ICD-10-CM | POA: Diagnosis not present

## 2023-03-18 DIAGNOSIS — E782 Mixed hyperlipidemia: Secondary | ICD-10-CM | POA: Diagnosis not present

## 2023-03-18 DIAGNOSIS — Z6826 Body mass index (BMI) 26.0-26.9, adult: Secondary | ICD-10-CM | POA: Diagnosis not present

## 2023-03-18 DIAGNOSIS — E1169 Type 2 diabetes mellitus with other specified complication: Secondary | ICD-10-CM | POA: Diagnosis not present

## 2023-03-18 DIAGNOSIS — I129 Hypertensive chronic kidney disease with stage 1 through stage 4 chronic kidney disease, or unspecified chronic kidney disease: Secondary | ICD-10-CM | POA: Diagnosis not present

## 2023-03-25 DIAGNOSIS — Z720 Tobacco use: Secondary | ICD-10-CM | POA: Diagnosis not present

## 2023-03-25 DIAGNOSIS — I129 Hypertensive chronic kidney disease with stage 1 through stage 4 chronic kidney disease, or unspecified chronic kidney disease: Secondary | ICD-10-CM | POA: Diagnosis not present

## 2023-03-25 DIAGNOSIS — E785 Hyperlipidemia, unspecified: Secondary | ICD-10-CM | POA: Diagnosis not present

## 2023-03-25 DIAGNOSIS — E1122 Type 2 diabetes mellitus with diabetic chronic kidney disease: Secondary | ICD-10-CM | POA: Diagnosis not present

## 2023-03-25 DIAGNOSIS — R809 Proteinuria, unspecified: Secondary | ICD-10-CM | POA: Diagnosis not present

## 2023-03-25 DIAGNOSIS — N183 Chronic kidney disease, stage 3 unspecified: Secondary | ICD-10-CM | POA: Diagnosis not present

## 2023-04-10 DIAGNOSIS — E890 Postprocedural hypothyroidism: Secondary | ICD-10-CM | POA: Diagnosis not present

## 2023-04-10 DIAGNOSIS — I129 Hypertensive chronic kidney disease with stage 1 through stage 4 chronic kidney disease, or unspecified chronic kidney disease: Secondary | ICD-10-CM | POA: Diagnosis not present

## 2023-05-08 DIAGNOSIS — I129 Hypertensive chronic kidney disease with stage 1 through stage 4 chronic kidney disease, or unspecified chronic kidney disease: Secondary | ICD-10-CM | POA: Diagnosis not present

## 2023-05-08 DIAGNOSIS — E890 Postprocedural hypothyroidism: Secondary | ICD-10-CM | POA: Diagnosis not present

## 2023-06-08 DIAGNOSIS — E1169 Type 2 diabetes mellitus with other specified complication: Secondary | ICD-10-CM | POA: Diagnosis not present

## 2023-06-08 DIAGNOSIS — J449 Chronic obstructive pulmonary disease, unspecified: Secondary | ICD-10-CM | POA: Diagnosis not present

## 2023-07-08 DIAGNOSIS — J449 Chronic obstructive pulmonary disease, unspecified: Secondary | ICD-10-CM | POA: Diagnosis not present

## 2023-07-08 DIAGNOSIS — E1169 Type 2 diabetes mellitus with other specified complication: Secondary | ICD-10-CM | POA: Diagnosis not present

## 2023-07-12 DIAGNOSIS — E890 Postprocedural hypothyroidism: Secondary | ICD-10-CM | POA: Diagnosis not present

## 2023-07-12 DIAGNOSIS — I129 Hypertensive chronic kidney disease with stage 1 through stage 4 chronic kidney disease, or unspecified chronic kidney disease: Secondary | ICD-10-CM | POA: Diagnosis not present

## 2023-07-12 DIAGNOSIS — E1169 Type 2 diabetes mellitus with other specified complication: Secondary | ICD-10-CM | POA: Diagnosis not present

## 2023-07-19 DIAGNOSIS — E1122 Type 2 diabetes mellitus with diabetic chronic kidney disease: Secondary | ICD-10-CM | POA: Diagnosis not present

## 2023-07-19 DIAGNOSIS — E785 Hyperlipidemia, unspecified: Secondary | ICD-10-CM | POA: Diagnosis not present

## 2023-07-19 DIAGNOSIS — J449 Chronic obstructive pulmonary disease, unspecified: Secondary | ICD-10-CM | POA: Diagnosis not present

## 2023-07-19 DIAGNOSIS — E890 Postprocedural hypothyroidism: Secondary | ICD-10-CM | POA: Diagnosis not present

## 2023-07-19 DIAGNOSIS — E1169 Type 2 diabetes mellitus with other specified complication: Secondary | ICD-10-CM | POA: Diagnosis not present

## 2023-07-19 DIAGNOSIS — Z6825 Body mass index (BMI) 25.0-25.9, adult: Secondary | ICD-10-CM | POA: Diagnosis not present

## 2023-07-19 DIAGNOSIS — I129 Hypertensive chronic kidney disease with stage 1 through stage 4 chronic kidney disease, or unspecified chronic kidney disease: Secondary | ICD-10-CM | POA: Diagnosis not present

## 2023-07-19 DIAGNOSIS — R809 Proteinuria, unspecified: Secondary | ICD-10-CM | POA: Diagnosis not present

## 2023-07-19 DIAGNOSIS — Z720 Tobacco use: Secondary | ICD-10-CM | POA: Diagnosis not present

## 2023-07-19 DIAGNOSIS — N183 Chronic kidney disease, stage 3 unspecified: Secondary | ICD-10-CM | POA: Diagnosis not present

## 2023-07-19 DIAGNOSIS — E782 Mixed hyperlipidemia: Secondary | ICD-10-CM | POA: Diagnosis not present

## 2023-08-03 DIAGNOSIS — R809 Proteinuria, unspecified: Secondary | ICD-10-CM | POA: Diagnosis not present

## 2023-08-08 DIAGNOSIS — J449 Chronic obstructive pulmonary disease, unspecified: Secondary | ICD-10-CM | POA: Diagnosis not present

## 2023-08-08 DIAGNOSIS — E1169 Type 2 diabetes mellitus with other specified complication: Secondary | ICD-10-CM | POA: Diagnosis not present

## 2023-08-19 DIAGNOSIS — N183 Chronic kidney disease, stage 3 unspecified: Secondary | ICD-10-CM | POA: Diagnosis not present

## 2023-09-07 DIAGNOSIS — J449 Chronic obstructive pulmonary disease, unspecified: Secondary | ICD-10-CM | POA: Diagnosis not present

## 2023-09-07 DIAGNOSIS — E1169 Type 2 diabetes mellitus with other specified complication: Secondary | ICD-10-CM | POA: Diagnosis not present

## 2023-10-08 DIAGNOSIS — J449 Chronic obstructive pulmonary disease, unspecified: Secondary | ICD-10-CM | POA: Diagnosis not present

## 2023-10-08 DIAGNOSIS — E1169 Type 2 diabetes mellitus with other specified complication: Secondary | ICD-10-CM | POA: Diagnosis not present

## 2023-11-05 DIAGNOSIS — E119 Type 2 diabetes mellitus without complications: Secondary | ICD-10-CM | POA: Diagnosis not present

## 2023-11-08 DIAGNOSIS — E1169 Type 2 diabetes mellitus with other specified complication: Secondary | ICD-10-CM | POA: Diagnosis not present

## 2023-11-08 DIAGNOSIS — J449 Chronic obstructive pulmonary disease, unspecified: Secondary | ICD-10-CM | POA: Diagnosis not present

## 2023-11-12 DIAGNOSIS — N183 Chronic kidney disease, stage 3 unspecified: Secondary | ICD-10-CM | POA: Diagnosis not present

## 2023-11-16 DIAGNOSIS — I129 Hypertensive chronic kidney disease with stage 1 through stage 4 chronic kidney disease, or unspecified chronic kidney disease: Secondary | ICD-10-CM | POA: Diagnosis not present

## 2023-11-16 DIAGNOSIS — E890 Postprocedural hypothyroidism: Secondary | ICD-10-CM | POA: Diagnosis not present

## 2023-11-16 DIAGNOSIS — E1169 Type 2 diabetes mellitus with other specified complication: Secondary | ICD-10-CM | POA: Diagnosis not present

## 2023-11-17 DIAGNOSIS — Z720 Tobacco use: Secondary | ICD-10-CM | POA: Diagnosis not present

## 2023-11-17 DIAGNOSIS — N183 Chronic kidney disease, stage 3 unspecified: Secondary | ICD-10-CM | POA: Diagnosis not present

## 2023-11-17 DIAGNOSIS — I129 Hypertensive chronic kidney disease with stage 1 through stage 4 chronic kidney disease, or unspecified chronic kidney disease: Secondary | ICD-10-CM | POA: Diagnosis not present

## 2023-11-17 DIAGNOSIS — R809 Proteinuria, unspecified: Secondary | ICD-10-CM | POA: Diagnosis not present

## 2023-11-17 DIAGNOSIS — E1122 Type 2 diabetes mellitus with diabetic chronic kidney disease: Secondary | ICD-10-CM | POA: Diagnosis not present

## 2023-11-17 DIAGNOSIS — E785 Hyperlipidemia, unspecified: Secondary | ICD-10-CM | POA: Diagnosis not present

## 2023-11-29 DIAGNOSIS — E782 Mixed hyperlipidemia: Secondary | ICD-10-CM | POA: Diagnosis not present

## 2023-11-29 DIAGNOSIS — Z23 Encounter for immunization: Secondary | ICD-10-CM | POA: Diagnosis not present

## 2023-11-29 DIAGNOSIS — Z6825 Body mass index (BMI) 25.0-25.9, adult: Secondary | ICD-10-CM | POA: Diagnosis not present

## 2023-11-29 DIAGNOSIS — N1831 Chronic kidney disease, stage 3a: Secondary | ICD-10-CM | POA: Diagnosis not present

## 2023-11-29 DIAGNOSIS — I129 Hypertensive chronic kidney disease with stage 1 through stage 4 chronic kidney disease, or unspecified chronic kidney disease: Secondary | ICD-10-CM | POA: Diagnosis not present

## 2023-11-29 DIAGNOSIS — E1169 Type 2 diabetes mellitus with other specified complication: Secondary | ICD-10-CM | POA: Diagnosis not present

## 2023-11-29 DIAGNOSIS — Z1331 Encounter for screening for depression: Secondary | ICD-10-CM | POA: Diagnosis not present

## 2023-12-14 ENCOUNTER — Other Ambulatory Visit: Payer: Self-pay | Admitting: Family Medicine

## 2023-12-14 DIAGNOSIS — Z1231 Encounter for screening mammogram for malignant neoplasm of breast: Secondary | ICD-10-CM

## 2024-01-03 ENCOUNTER — Ambulatory Visit

## 2024-01-08 DIAGNOSIS — E1169 Type 2 diabetes mellitus with other specified complication: Secondary | ICD-10-CM | POA: Diagnosis not present

## 2024-01-08 DIAGNOSIS — J449 Chronic obstructive pulmonary disease, unspecified: Secondary | ICD-10-CM | POA: Diagnosis not present

## 2024-01-19 ENCOUNTER — Ambulatory Visit
Admission: RE | Admit: 2024-01-19 | Discharge: 2024-01-19 | Disposition: A | Discharge status: Home / Self Care | Payer: Medicare Other | Source: Ambulatory Visit | Attending: Family Medicine | Admitting: Family Medicine

## 2024-01-19 DIAGNOSIS — Z1231 Encounter for screening mammogram for malignant neoplasm of breast: Secondary | ICD-10-CM | POA: Diagnosis not present

## 2024-02-07 ENCOUNTER — Other Ambulatory Visit (HOSPITAL_BASED_OUTPATIENT_CLINIC_OR_DEPARTMENT_OTHER): Payer: Self-pay | Admitting: Family Medicine

## 2024-02-07 DIAGNOSIS — J449 Chronic obstructive pulmonary disease, unspecified: Secondary | ICD-10-CM | POA: Diagnosis not present

## 2024-02-07 DIAGNOSIS — R911 Solitary pulmonary nodule: Secondary | ICD-10-CM

## 2024-02-07 DIAGNOSIS — E1169 Type 2 diabetes mellitus with other specified complication: Secondary | ICD-10-CM | POA: Diagnosis not present

## 2024-02-15 ENCOUNTER — Other Ambulatory Visit (HOSPITAL_BASED_OUTPATIENT_CLINIC_OR_DEPARTMENT_OTHER): Admitting: Radiology

## 2024-02-15 ENCOUNTER — Inpatient Hospital Stay (HOSPITAL_BASED_OUTPATIENT_CLINIC_OR_DEPARTMENT_OTHER): Admission: RE | Admit: 2024-02-15 | Discharge: 2024-02-15 | Attending: Family Medicine | Admitting: Family Medicine

## 2024-02-15 DIAGNOSIS — I700 Atherosclerosis of aorta: Secondary | ICD-10-CM | POA: Diagnosis not present

## 2024-02-15 DIAGNOSIS — R911 Solitary pulmonary nodule: Secondary | ICD-10-CM

## 2024-02-15 DIAGNOSIS — J439 Emphysema, unspecified: Secondary | ICD-10-CM | POA: Diagnosis not present

## 2024-02-15 DIAGNOSIS — R918 Other nonspecific abnormal finding of lung field: Secondary | ICD-10-CM | POA: Diagnosis not present

## 2024-04-17 ENCOUNTER — Ambulatory Visit
